# Patient Record
Sex: Female | Born: 1980 | Race: White | Hispanic: No | Marital: Married | Smoking: Former smoker
Health system: Southern US, Community
[De-identification: ages and names within clinical notes are randomized; demographics above are authoritative.]

## PROBLEM LIST (undated history)

## (undated) DIAGNOSIS — Z8759 Personal history of other complications of pregnancy, childbirth and the puerperium: Secondary | ICD-10-CM

## (undated) DIAGNOSIS — IMO0001 Reserved for inherently not codable concepts without codable children: Secondary | ICD-10-CM

## (undated) DIAGNOSIS — R519 Headache, unspecified: Secondary | ICD-10-CM

## (undated) DIAGNOSIS — F329 Major depressive disorder, single episode, unspecified: Secondary | ICD-10-CM

## (undated) DIAGNOSIS — F419 Anxiety disorder, unspecified: Secondary | ICD-10-CM

## (undated) DIAGNOSIS — R51 Headache: Secondary | ICD-10-CM

## (undated) DIAGNOSIS — F32A Depression, unspecified: Secondary | ICD-10-CM

## (undated) DIAGNOSIS — I10 Essential (primary) hypertension: Secondary | ICD-10-CM

## (undated) DIAGNOSIS — F319 Bipolar disorder, unspecified: Secondary | ICD-10-CM

## (undated) DIAGNOSIS — N13721 Vesicoureteral-reflux with reflux nephropathy without hydroureter, unilateral: Secondary | ICD-10-CM

## (undated) HISTORY — DX: Vesicoureteral-reflux with reflux nephropathy without hydroureter, unilateral: N13.721

## (undated) HISTORY — DX: Depression, unspecified: F32.A

## (undated) HISTORY — DX: Personal history of other complications of pregnancy, childbirth and the puerperium: Z87.59

## (undated) HISTORY — DX: Essential (primary) hypertension: I10

## (undated) HISTORY — DX: Major depressive disorder, single episode, unspecified: F32.9

---

## 2004-06-21 ENCOUNTER — Ambulatory Visit: Payer: Self-pay | Admitting: Gynecology

## 2004-11-05 ENCOUNTER — Inpatient Hospital Stay (HOSPITAL_COMMUNITY): Admission: AD | Admit: 2004-11-05 | Discharge: 2004-11-07 | Payer: Self-pay | Admitting: Gynecology

## 2004-11-10 ENCOUNTER — Ambulatory Visit: Payer: Self-pay

## 2009-05-31 ENCOUNTER — Emergency Department: Payer: Self-pay | Admitting: Unknown Physician Specialty

## 2010-01-05 ENCOUNTER — Ambulatory Visit: Payer: Self-pay | Admitting: Internal Medicine

## 2010-02-09 DIAGNOSIS — Z8759 Personal history of other complications of pregnancy, childbirth and the puerperium: Secondary | ICD-10-CM

## 2010-02-09 HISTORY — DX: Personal history of other complications of pregnancy, childbirth and the puerperium: Z87.59

## 2010-02-21 ENCOUNTER — Ambulatory Visit: Payer: Self-pay | Admitting: Obstetrics and Gynecology

## 2010-02-21 LAB — CONVERTED CEMR LAB
Antibody Screen: NEGATIVE
Hemoglobin: 16.1 g/dL — ABNORMAL HIGH (ref 12.0–15.0)
Hepatitis B Surface Ag: NEGATIVE
MCHC: 33.5 g/dL (ref 30.0–36.0)
Monocytes Relative: 4 % (ref 3–12)
Neutro Abs: 8.3 10*3/uL — ABNORMAL HIGH (ref 1.7–7.7)
Platelets: 289 10*3/uL (ref 150–400)
RBC: 4.85 M/uL (ref 3.87–5.11)
Rubella: 46.8 intl units/mL — ABNORMAL HIGH

## 2010-02-22 ENCOUNTER — Ambulatory Visit (HOSPITAL_COMMUNITY): Admission: RE | Admit: 2010-02-22 | Discharge: 2010-02-22 | Payer: Self-pay | Admitting: Obstetrics & Gynecology

## 2010-02-22 ENCOUNTER — Ambulatory Visit: Payer: Self-pay | Admitting: Obstetrics & Gynecology

## 2010-02-27 ENCOUNTER — Inpatient Hospital Stay (HOSPITAL_COMMUNITY): Admission: AD | Admit: 2010-02-27 | Discharge: 2010-02-27 | Payer: Self-pay | Admitting: Obstetrics & Gynecology

## 2010-02-27 ENCOUNTER — Ambulatory Visit: Payer: Self-pay | Admitting: Advanced Practice Midwife

## 2010-02-28 ENCOUNTER — Ambulatory Visit: Payer: Self-pay | Admitting: Obstetrics & Gynecology

## 2010-02-28 ENCOUNTER — Encounter: Payer: Self-pay | Admitting: Family Medicine

## 2010-02-28 LAB — CONVERTED CEMR LAB
Creatinine 24 HR UR: 1657 mg/24hr (ref 700–1800)
Creatinine, Urine: 73.6 mg/dL

## 2010-03-01 ENCOUNTER — Inpatient Hospital Stay (HOSPITAL_COMMUNITY): Admission: AD | Admit: 2010-03-01 | Discharge: 2010-03-01 | Payer: Self-pay | Admitting: Obstetrics & Gynecology

## 2010-03-01 ENCOUNTER — Ambulatory Visit: Payer: Self-pay | Admitting: Nurse Practitioner

## 2010-03-03 ENCOUNTER — Ambulatory Visit: Payer: Self-pay | Admitting: Obstetrics and Gynecology

## 2010-03-07 ENCOUNTER — Ambulatory Visit: Payer: Self-pay | Admitting: Obstetrics and Gynecology

## 2010-03-07 LAB — CONVERTED CEMR LAB
Trich, Wet Prep: NONE SEEN
Yeast Wet Prep HPF POC: NONE SEEN

## 2010-10-14 ENCOUNTER — Ambulatory Visit: Payer: Self-pay

## 2010-10-20 ENCOUNTER — Other Ambulatory Visit: Payer: 59

## 2010-10-20 ENCOUNTER — Encounter (INDEPENDENT_AMBULATORY_CARE_PROVIDER_SITE_OTHER): Payer: Self-pay | Admitting: *Deleted

## 2010-10-20 DIAGNOSIS — R030 Elevated blood-pressure reading, without diagnosis of hypertension: Secondary | ICD-10-CM

## 2010-10-20 DIAGNOSIS — N912 Amenorrhea, unspecified: Secondary | ICD-10-CM

## 2010-10-20 LAB — CONVERTED CEMR LAB: TSH: 0.9 microintl units/mL (ref 0.350–4.500)

## 2010-11-27 LAB — URINALYSIS, ROUTINE W REFLEX MICROSCOPIC
Bilirubin Urine: NEGATIVE
Bilirubin Urine: NEGATIVE
Glucose, UA: NEGATIVE mg/dL
Glucose, UA: NEGATIVE mg/dL
Ketones, ur: NEGATIVE mg/dL
Nitrite: NEGATIVE
Protein, ur: NEGATIVE mg/dL
Protein, ur: NEGATIVE mg/dL
Specific Gravity, Urine: 1.025 (ref 1.005–1.030)
Specific Gravity, Urine: >1.03 — ABNORMAL HIGH (ref 1.005–1.030)
Urobilinogen, UA: 0.2 mg/dL (ref 0.0–1.0)
pH: 5.5 (ref 5.0–8.0)
pH: 5.5 (ref 5.0–8.0)

## 2010-11-27 LAB — URINE MICROSCOPIC-ADD ON

## 2010-11-27 LAB — CBC
Hemoglobin: 14.9 g/dL (ref 12.0–15.0)
MCHC: 35.3 g/dL (ref 30.0–36.0)
Platelets: 249 10*3/uL (ref 150–400)
RDW: 12.4 % (ref 11.5–15.5)

## 2010-11-27 LAB — HCG, QUANTITATIVE, PREGNANCY: hCG, Beta Chain, Quant, S: 436 m[IU]/mL — ABNORMAL HIGH (ref <5)

## 2010-11-27 LAB — RH IMMUNE GLOBULIN WORKUP (NOT WOMEN'S HOSP): Antibody Screen: NEGATIVE

## 2010-12-19 ENCOUNTER — Ambulatory Visit (INDEPENDENT_AMBULATORY_CARE_PROVIDER_SITE_OTHER): Payer: 59 | Admitting: Family Medicine

## 2010-12-19 ENCOUNTER — Encounter: Payer: Self-pay | Admitting: Family Medicine

## 2010-12-19 DIAGNOSIS — R5381 Other malaise: Secondary | ICD-10-CM

## 2010-12-19 DIAGNOSIS — R300 Dysuria: Secondary | ICD-10-CM

## 2010-12-19 DIAGNOSIS — Z8759 Personal history of other complications of pregnancy, childbirth and the puerperium: Secondary | ICD-10-CM

## 2010-12-19 DIAGNOSIS — Z136 Encounter for screening for cardiovascular disorders: Secondary | ICD-10-CM

## 2010-12-19 DIAGNOSIS — N13721 Vesicoureteral-reflux with reflux nephropathy without hydroureter, unilateral: Secondary | ICD-10-CM

## 2010-12-19 DIAGNOSIS — R5383 Other fatigue: Secondary | ICD-10-CM

## 2010-12-19 DIAGNOSIS — F329 Major depressive disorder, single episode, unspecified: Secondary | ICD-10-CM

## 2010-12-19 DIAGNOSIS — I10 Essential (primary) hypertension: Secondary | ICD-10-CM

## 2010-12-19 DIAGNOSIS — F3289 Other specified depressive episodes: Secondary | ICD-10-CM

## 2010-12-19 LAB — BASIC METABOLIC PANEL
CO2: 28 mEq/L (ref 19–32)
Calcium: 9.2 mg/dL (ref 8.4–10.5)
Chloride: 101 mEq/L (ref 96–112)
Creatinine, Ser: 0.6 mg/dL (ref 0.4–1.2)
GFR: 127.31 mL/min (ref >60.00)
Glucose, Bld: 91 mg/dL (ref 70–99)
Potassium: 4.1 mEq/L (ref 3.5–5.1)
Sodium: 138 mEq/L (ref 135–145)

## 2010-12-19 LAB — POCT URINALYSIS DIPSTICK
Bilirubin, UA: NEGATIVE
Glucose, UA: NEGATIVE
Nitrite, UA: POSITIVE
Protein, UA: 30
Urobilinogen, UA: 0.2

## 2010-12-19 LAB — LIPID PANEL
Cholesterol: 195 mg/dL (ref 0–200)
Total CHOL/HDL Ratio: 6
VLDL: 73.6 mg/dL — ABNORMAL HIGH (ref 0.0–40.0)

## 2010-12-19 LAB — CBC WITH DIFFERENTIAL/PLATELET
Basophils Relative: 0.3 % (ref 0.0–3.0)
Lymphocytes Relative: 20.7 % (ref 12.0–46.0)
Lymphs Abs: 2 10*3/uL (ref 0.7–4.0)
MCHC: 35.3 g/dL (ref 30.0–36.0)
Monocytes Relative: 3.1 % (ref 3.0–12.0)
Neutro Abs: 7.2 10*3/uL (ref 1.4–7.7)

## 2010-12-19 NOTE — Patient Instructions (Signed)
Great to meet you. Please stop by to see Marion on your way out. 

## 2010-12-19 NOTE — Assessment & Plan Note (Signed)
Continue Labetolol 100 mg twice daily. Check BMET and renal ultrasound given her history.

## 2010-12-19 NOTE — Assessment & Plan Note (Signed)
UA pos for LE.   Will send for culture first since she is asymptomatic and not yet pregnant. The patient indicates understanding of these issues and agrees with the plan. Will also schedule renal ultrasound due to HTN.

## 2010-12-19 NOTE — Assessment & Plan Note (Signed)
Likely multifactorial. Will check labs- TSH, VIt D, CBC, B12 to rule out reversible causes. Depression likely playing a role, refer to psych.

## 2010-12-19 NOTE — Progress Notes (Signed)
30 yo here to establish care.  1. HTN- diagnosed 7 years ago.  On Labetolol 100 mg twice daily because she has been trying to get pregnant. 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  Never had an ultrasound of kidneys as adults. BP has been very high- 170s/100s. She is currently trying to get pregnant again. Denies any CP, SOB, blurred vision or LE edema.  2.  Fatigue- very fatigued for past several months.  Does not feel it is worse on BP medication. Admits to symptoms of depression- tearfulness,anhedonia.  Denies any SI or HI. Does not want any antidepressants because she is trying to get pregnant. Work has been more stressful, at times having difficulty falling asleep at night because she is thinking about work.  3. ?UTI- has frequent UTIs.  Currently asymptomatic but would like to get UA.  The PMH, PSH, Social History, Family History, Medications, and allergies have been reviewed in [facility], and have been updated if relevant.  ROS: General: Denies fever, chills, sweats. Endorses weight gain. Eyes: Denies blurring,significant itching ENT: Denies earache, sore throat, and hoarseness.  Cardiovascular: Denies chest pains, palpitations, dyspnea on exertion,  Respiratory: Denies cough, dyspnea at rest,wheeezing Breast: no concerns about lumps GI: Denies nausea, vomiting, diarrhea, constipation, change in bowel habits, abdominal pain, melena, hematochezia GU: Denies dysuria, hematuria, urinary hesitancy, nocturia, denies STD risk, no concerns about discharge Musculoskeletal: Denies back pain, joint pain Derm: Denies rash, itching Neuro: Denies  paresthesias, frequent falls, frequent headaches Psych: Denies anxiety, SI or HI.  Endorses depression. Endocrine: Denies cold intolerance, heat intolerance, polydipsia Heme: Denies enlarged lymph nodes Allergy: No hayfever  Physical exam: BP 136/88  Pulse 82  Temp 98.6 F (37 C)  Wt 179 lb 12.8 oz (81.557 kg)  General:   Well-developed,well-nourished,in no acute distress; alert,appropriate and cooperative throughout examination Head:  normocephalic and atraumatic.   Eyes:  vision grossly intact, pupils equal, pupils round, and pupils reactive to light.   Ears:  R ear normal and L ear normal.   Nose:  no external deformity.   Mouth:  good dentition.   Neck:  No deformities, masses, or tenderness noted. Lungs:  Normal respiratory effort, chest expands symmetrically. Lungs are clear to auscultation, no crackles or wheezes. Heart:  Normal rate and regular rhythm. S1 and S2 normal without gallop, murmur, click, rub or other extra sounds. Abdomen:  Bowel sounds positive,abdomen soft and non-tender without masses, organomegaly or hernias noted. Msk:  No deformity or scoliosis noted of thoracic or lumbar spine.   Extremities:  No clubbing, cyanosis, edema, or deformity noted with normal full range of motion of all joints.   Neurologic:  alert & oriented X3 and gait normal.   Skin:  Intact without suspicious lesions or rashes Cervical Nodes:  No lymphadenopathy noted Axillary Nodes:  No palpable lymphadenopathy Psych:  Cognition and judgment appear intact. Alert and cooperative with normal attention span and concentration. No apparent delusions, illusions, hallucinations

## 2010-12-19 NOTE — Assessment & Plan Note (Signed)
Deteriorated. Refer for psychotherapy. Follow up in one month.

## 2010-12-20 ENCOUNTER — Ambulatory Visit: Payer: Self-pay | Admitting: Family Medicine

## 2010-12-20 ENCOUNTER — Other Ambulatory Visit: Payer: Self-pay | Admitting: Family Medicine

## 2010-12-20 LAB — VITAMIN D 25 HYDROXY (VIT D DEFICIENCY, FRACTURES): Vit D, 25-Hydroxy: 31 ng/mL (ref 30–89)

## 2010-12-20 MED ORDER — CIPROFLOXACIN HCL 500 MG PO TABS: 500.0000 mg | ORAL_TABLET | Freq: Two times a day (BID) | ORAL | Status: AC

## 2010-12-21 ENCOUNTER — Encounter: Payer: Self-pay | Admitting: *Deleted

## 2010-12-21 LAB — URINE CULTURE: Colony Count: >=100000

## 2010-12-22 ENCOUNTER — Encounter: Payer: Self-pay | Admitting: Family Medicine

## 2010-12-28 ENCOUNTER — Ambulatory Visit (INDEPENDENT_AMBULATORY_CARE_PROVIDER_SITE_OTHER): Payer: 59 | Admitting: Psychology

## 2010-12-28 DIAGNOSIS — F331 Major depressive disorder, recurrent, moderate: Secondary | ICD-10-CM

## 2011-01-17 ENCOUNTER — Ambulatory Visit: Payer: 59 | Admitting: Psychology

## 2011-07-27 ENCOUNTER — Emergency Department: Payer: Self-pay | Admitting: *Deleted

## 2011-12-01 ENCOUNTER — Emergency Department: Payer: Self-pay | Admitting: Emergency Medicine

## 2011-12-01 LAB — CBC
MCH: 33.3 pg (ref 26.0–34.0)
MCHC: 34.7 g/dL (ref 32.0–36.0)
MCV: 96 fL (ref 80–100)
Platelet: 287 10*3/uL (ref 150–440)
RBC: 4.26 10*6/uL (ref 3.80–5.20)
WBC: 9.5 10*3/uL (ref 3.6–11.0)

## 2011-12-01 LAB — COMPREHENSIVE METABOLIC PANEL
Alkaline Phosphatase: 49 U/L — ABNORMAL LOW (ref 50–136)
Bilirubin,Total: 0.7 mg/dL (ref 0.2–1.0)
Calcium, Total: 9.3 mg/dL (ref 8.5–10.1)
Creatinine: 0.74 mg/dL (ref 0.60–1.30)
EGFR (African American): >60
EGFR (Non-African Amer.): >60
Glucose: 82 mg/dL (ref 65–99)
Potassium: 3.1 mmol/L — ABNORMAL LOW (ref 3.5–5.1)
Sodium: 136 mmol/L (ref 136–145)

## 2011-12-02 ENCOUNTER — Emergency Department: Payer: Self-pay | Admitting: Emergency Medicine

## 2011-12-02 LAB — DRUG SCREEN, URINE
Barbiturates, Ur Screen: NEGATIVE (ref ?–200)
Benzodiazepine, Ur Scrn: POSITIVE (ref ?–200)
Cannabinoid 50 Ng, Ur ~~LOC~~: NEGATIVE (ref ?–50)
Cocaine Metabolite,Ur ~~LOC~~: NEGATIVE (ref ?–300)
Methadone, Ur Screen: NEGATIVE (ref ?–300)
Phencyclidine (PCP) Ur S: NEGATIVE (ref ?–25)
Tricyclic, Ur Screen: NEGATIVE (ref ?–1000)

## 2011-12-02 LAB — SALICYLATE LEVEL: Salicylates, Serum: <1.7 mg/dL

## 2011-12-02 LAB — URINALYSIS, COMPLETE
Bilirubin,UR: NEGATIVE
Glucose,UR: NEGATIVE mg/dL (ref 0–75)
Ketone: NEGATIVE
Nitrite: NEGATIVE
RBC,UR: NONE SEEN /HPF (ref 0–5)
Squamous Epithelial: 46
WBC UR: 5 /HPF (ref 0–5)

## 2011-12-02 LAB — COMPREHENSIVE METABOLIC PANEL
Albumin: 4 g/dL (ref 3.4–5.0)
Alkaline Phosphatase: 51 U/L (ref 50–136)
Anion Gap: 10 (ref 7–16)
Bilirubin,Total: 0.4 mg/dL (ref 0.2–1.0)
Calcium, Total: 8.7 mg/dL (ref 8.5–10.1)
Co2: 31 mmol/L (ref 21–32)
Creatinine: 0.77 mg/dL (ref 0.60–1.30)
Glucose: 99 mg/dL (ref 65–99)
Osmolality: 282 (ref 275–301)
Potassium: 3.2 mmol/L — ABNORMAL LOW (ref 3.5–5.1)
Sodium: 143 mmol/L (ref 136–145)

## 2011-12-02 LAB — CBC
HCT: 40.6 % (ref 35.0–47.0)
MCH: 33.7 pg (ref 26.0–34.0)
MCHC: 35.6 g/dL (ref 32.0–36.0)
MCV: 95 fL (ref 80–100)
RDW: 12.9 % (ref 11.5–14.5)

## 2011-12-02 LAB — TSH: Thyroid Stimulating Horm: 1.42 u[IU]/mL

## 2011-12-02 LAB — ETHANOL: Ethanol: <3 mg/dL

## 2012-01-01 ENCOUNTER — Ambulatory Visit: Payer: Self-pay | Admitting: Family Medicine

## 2012-01-10 ENCOUNTER — Ambulatory Visit: Payer: Self-pay | Admitting: Neurology

## 2012-01-10 LAB — CREATININE, SERUM
Creatinine: 0.77 mg/dL (ref 0.60–1.30)
EGFR (African American): >60
EGFR (Non-African Amer.): >60

## 2012-07-06 ENCOUNTER — Ambulatory Visit: Payer: Self-pay | Admitting: Physician Assistant

## 2012-10-03 ENCOUNTER — Ambulatory Visit: Payer: Self-pay | Admitting: Internal Medicine

## 2012-10-16 ENCOUNTER — Ambulatory Visit: Payer: Self-pay | Admitting: Family Medicine

## 2013-12-29 ENCOUNTER — Ambulatory Visit: Payer: Self-pay | Admitting: Physician Assistant

## 2014-03-21 IMAGING — US ULTRASOUND LEFT BREAST
1 series · 13 of 13 positions shown · non-contrast
Comparison: None. This is the patient's baseline mammogram.

REASON FOR EXAM: LT BR PAIN 3 OCLOCK
COMMENTS:

PROCEDURE:     US  - US BREAST LEFT  - October 16, 2012  [DATE]
RESULT:

[Series 1: ultrasound left breast · 0.14mm/px · 13 of 13 slices shown]
[im 1/13]
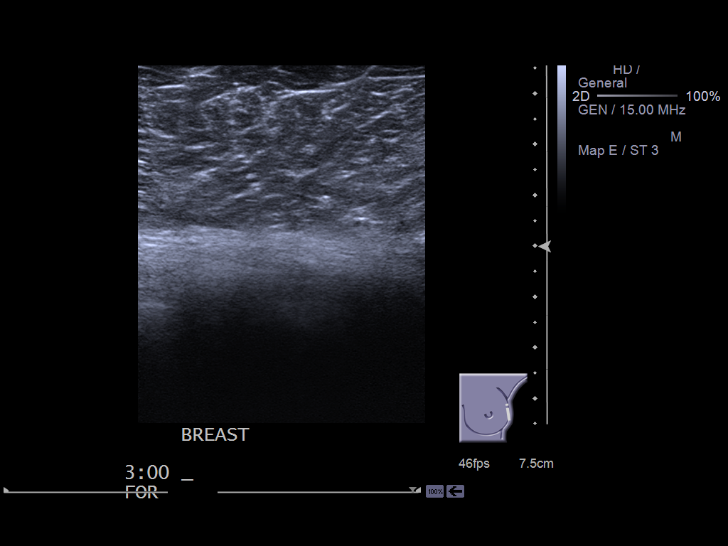
[im 2/13]
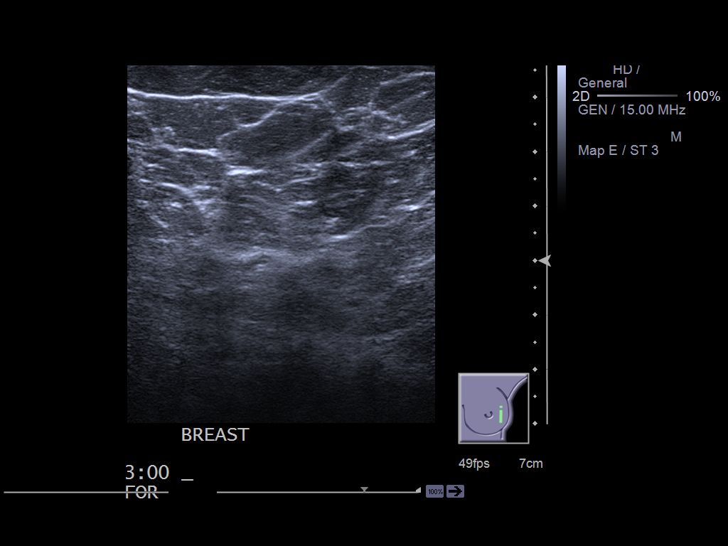
[im 3/13]
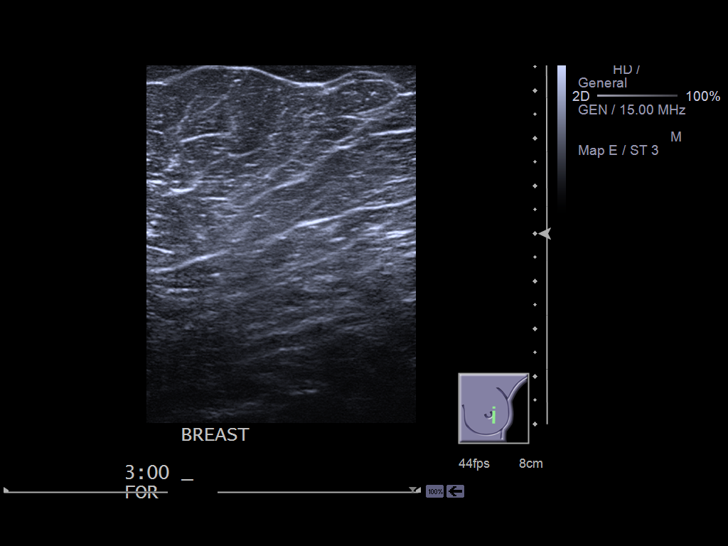
[im 4/13]
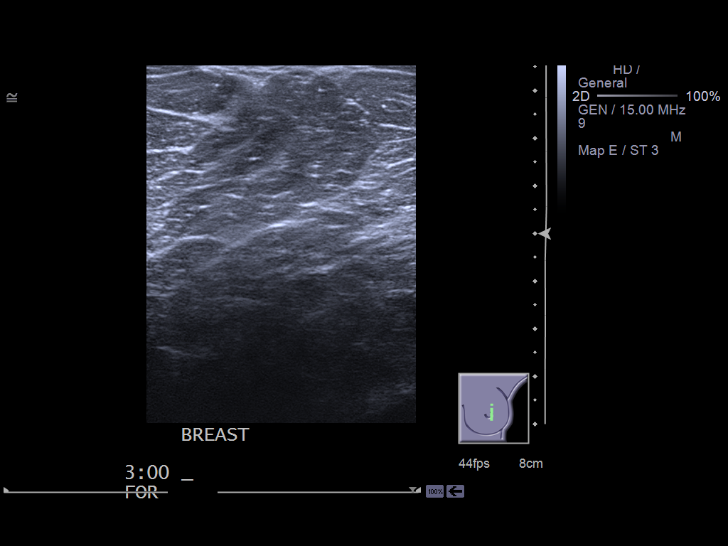
[im 5/13]
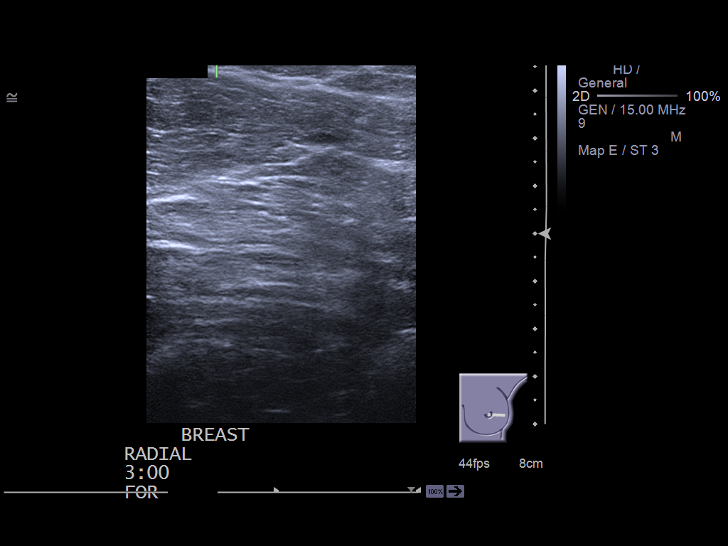
[im 6/13]
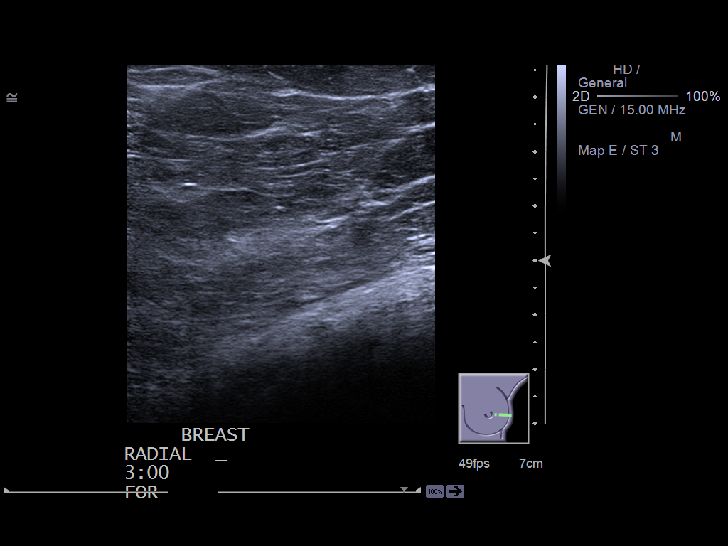
[im 7/13]
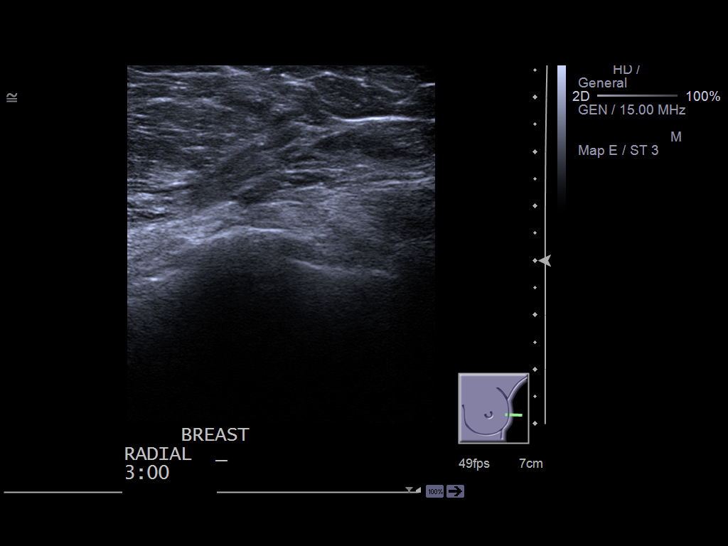
[im 8/13]
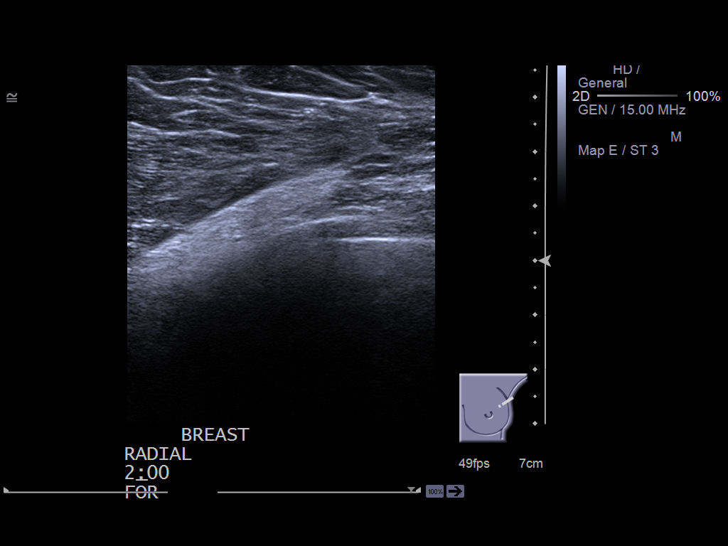
[im 9/13]
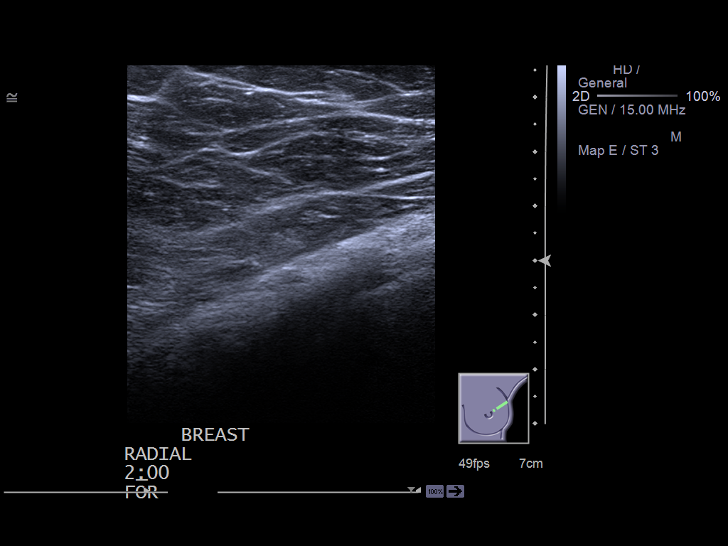
[im 10/13]
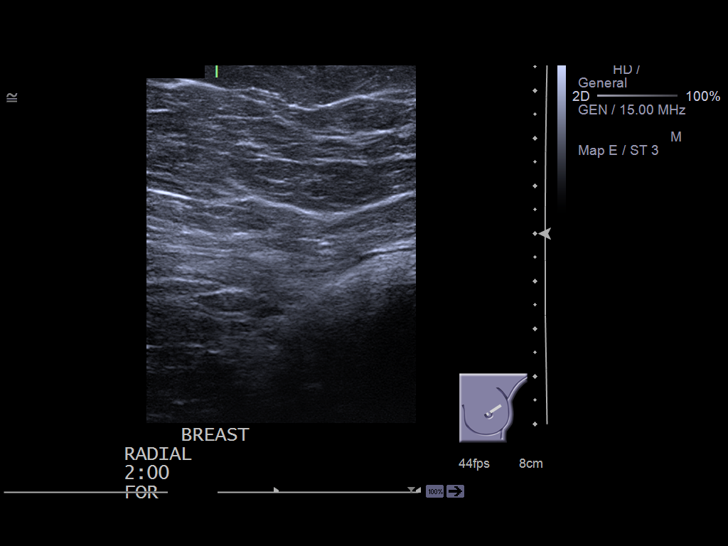
[im 11/13]
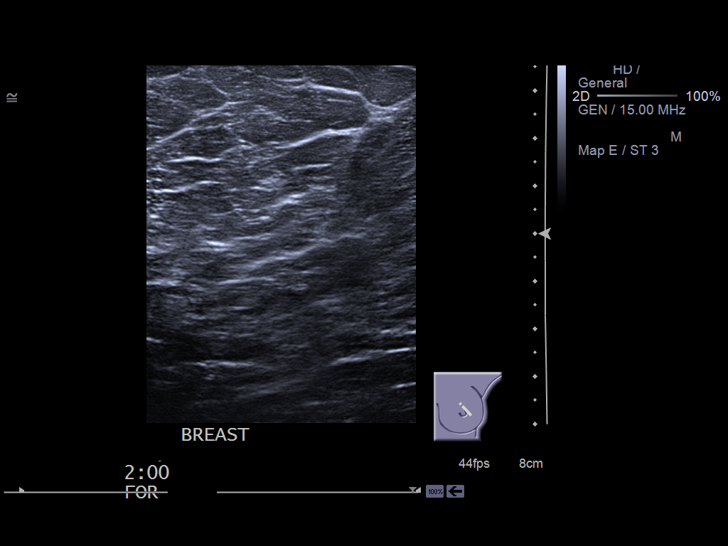
[im 12/13]
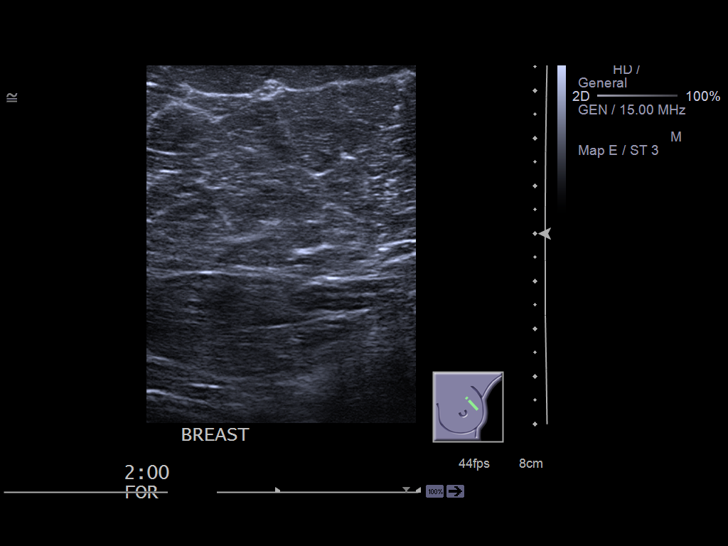
[im 13/13]
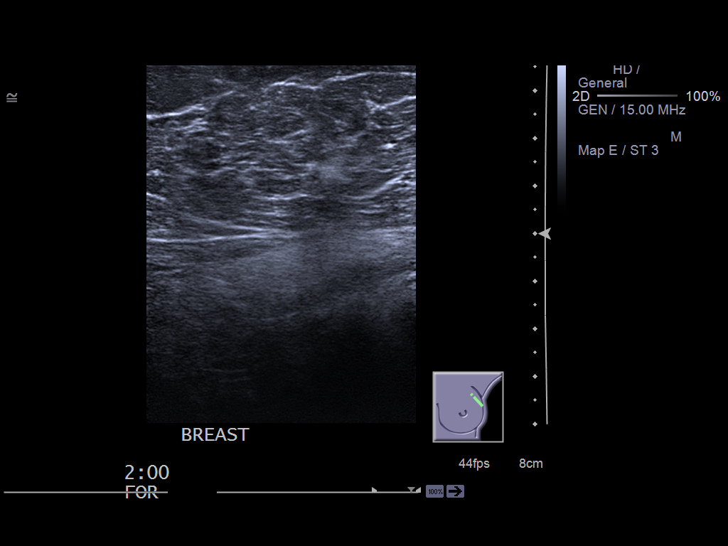

[13 of 13 positions shown; findings below may reference images not displayed]

FINDINGS: The breast tissue is almost entirely fatty. No suspicious masses or
calcifications are identified. No areas of architectural distortion. Spot
compression magnification views are performed of the far lateral aspect of
the left breast at approximately [DATE] secondary to reported breast pain at
this site. No mass or focal asymmetry was identified.

Real-time ultrasound was performed of the left breast from [DATE] to [DATE] to
include the region of the patient's reported pain. No mass or suspicious
shadowing is identified.
IMPRESSION: 1.     BI-RADS: Category 1 Negative.
2.     There is no mammographic or sonographic evidence of malignancy.
Recommend further evaluation and management of the patient's breast pain be
based on clinical grounds.
3.     Annual screening mammography is recommended starting at age 40.
Screening mammography can be performed prior to that date at the discretion
of the patient and the patient's physician.

BREAST COMPOSITION:  The breast composition is ALMOST ENTIRELY FATTY
(glandular tissue is less than 25%).

[REDACTED]

## 2014-07-13 ENCOUNTER — Encounter: Payer: Self-pay | Admitting: Family Medicine

## 2015-01-03 NOTE — Consult Note (Signed)
PATIENT NAME:  Cassandra Carter, Cassandra Carter MR#:  [id number] DATE OF BIRTH:  February 26, 1981  DATE OF CONSULTATION:  12/04/2011  REFERRING PHYSICIAN:  Emergency Room doctor, Dr. Marilynne HalstedBoland  CONSULTING PHYSICIAN:  Venida JarvisWilliam James Abed Schar II, MD  REASON FOR CONSULTATION: Depression.   HISTORY OF PRESENT ILLNESS: The patient presented to the Emergency Room with depression in a weekend that she felt was bad. She reports recurrent depression all of her life slightly worse for the last two months. Symptoms have included depressed mood, slight decrease in interest, slight increase in guilt, slight decrease in concentration, some helplessness and hopelessness. Had passive suicidal thoughts this weekend. Has had some in the past two months but no actual plans.   The patient had been treated with Effexor in the past which did help but it caused increased blood pressure. She also reports Lexapro has helped in the past. Has never been on Celexa. Since coming to the Emergency Room, she has been placed on Wellbutrin, Celexa, and Risperdal. No history of psychosis. No history of mania. She may have had one panic attack over the weekend which aggravated her circumstances. In addition, she developed some ear pain which now is thought to be otitis media and she is being treated for that.   FAMILY HISTORY: Her sister had anger issues, otherwise negative.   PAST HISTORY: Hypertension, is on hydrochlorothiazide for that. Has been placed on another antihypertensive since coming to the hospital.   REVIEW OF SYSTEMS: She had left ear pain, now better; also had some slight sore throat which is better, otherwise negative.   SOCIAL HISTORY: The patient works as a Engineer, materialsteller at a bank. She has an Associate's Degree from college. She has been married two years. She has a 34-year-old son and also a supportive family and parents. She quit smoking two months ago which again could have been a precipitant to this depression. She has been a social drinker. No drugs.  When well, she enjoys drawing.   MENTAL STATUS EXAMINATION: Mental status revealed a white female who looked her stated age, cooperative, coherent, and able to give me a fairly good history. Not as distressed as on admission. She did not appear to be severely depressed but perhaps just slightly depressed but no psychomotor retardation. No hallucinations or delusions. Insight and judgment was fair to good. She was oriented x4, knew the presidents backwards x2, remembered 3 of 3 objects at one and three minutes.   DIAGNOSES:  AXIS I: Major depressive episode, mild.   AXIS III:  1. History of hypertension. 2. Probably otitis media.   AXIS IV: Some medical problems, lack of access to care.   AXIS V: 45. The patient is depressed, currently is not suicidal.   ASSESSMENT AND PLAN: The patient should be discharged. She will follow-up with a psychiatrist. Send the patient home on Celexa 20 mg daily. She also needs treatment for otitis media.   SUICIDE RISK ASSESSMENT: Current attempt negative; compulsivity negative; ideation intention plan negative; panic positive, high anxiety terminal and negative; anhedonia negative; concentration negative; hopelessness negative; insomnia negative; energy positive; treatment alliance negative; illness pain positive; prior attempts negative; substance abuse negative; family history negative; childhood abuse negative; marital status negative; family/children negative; firearms negative; age greater than 6865 negative; gender female negative.   ____________________________ Venida JarvisWilliam James Hosanna Betley II, MD [id number] D: 12/04/2011 11:49:37 ET T: 12/04/2011 12:07:57 ET JOB#: [id number]  cc: Venida JarvisWilliam James Keinan Brouillet II, MD, <Dictator> Jules HusbandsWILLIAM J Deklin Bieler MD ELECTRONICALLY SIGNED 12/06/2011 16:44

## 2015-05-18 ENCOUNTER — Encounter: Payer: BLUE CROSS/BLUE SHIELD | Attending: Psychiatry | Admitting: Dietician

## 2015-05-18 ENCOUNTER — Encounter: Payer: Self-pay | Admitting: Dietician

## 2015-05-18 VITALS — Ht 63.0 in | Wt 217.9 lb

## 2015-05-18 DIAGNOSIS — E669 Obesity, unspecified: Secondary | ICD-10-CM

## 2015-05-18 DIAGNOSIS — F509 Eating disorder, unspecified: Secondary | ICD-10-CM

## 2015-05-18 NOTE — Patient Instructions (Addendum)
Continue to work on establishing consistent meal and snack times. 8:30 breakfast 11:30 lunch 3:00 snack 6:30 dinner 8:30 snack  Continue to decrease sugar-sweetened beverages.  Balance meals with protein, 1-2 starch servings and a fruit or non-starchy vegetable serving or both. Refer to examples.  Include a snack between lunch and dinner that includes a starch and a protein food.  Limit snacking after dinner to 1 snack. Refer to examples. Avoid reading food labels.  Continue to keep food records.

## 2015-05-18 NOTE — Progress Notes (Signed)
Medical Nutrition Therapy: Visit start time: 13:30 end time: 14:30 Assessment:  Diagnosis: obesity, eating disorder Past medical history: hypertension, kidney disease Psychosocial issues/ stress concerns: Patient rates her stress as "high" and indicates "not so well" as to how she is dealing with her stress. She had her 1st session last week with Kara Dies [facility]), a clinical social worker who specializes in eating disorders.  Preferred learning method:  . Auditory . Visual Current weight: 217.9 lbs  Height: 63 in Medications, supplements: see list Progress and evaluation:  Patient came for initial medical nutrition evaluation visit. She states she has alternated between binge eating and not eating as long as she can remember and began purging approximately 3 years ago. Presently she purges by vomiting approximately 2 x/week. She reports that her not being able to decide whether to eat or what to eat has caused issues in her relationship with her husband and this prompted her to reach out for help. She reports her weight has been relatively stable in past 3 years. After meeting with her counselor, she has been keeping some food records, indicating also her emotional state at time of eating. She has been able at least once to distract herself and avoid vomiting when she felt she had eaten too much.  She and her husband live next to her mother and she reports her mother has been preparing some evening meals in order to help out during this time. Her diet is low in fruits/vegetables and calcium sources. She has decreased soda (Coke) from 12 (12oz) cans per day to 2-liters over a 3-4 day period. She also is drinking lemonade to help her limit soda. Physical activity: none  Dietary Intake:  Usual eating pattern includes 3 meals and 1-2 snacks per day. Dining out frequency: 2 meals per week.  Breakfast: flavored oatmeal or cereal/milk Lunch: cheeseburger/fries, lemonade or  Coke Supper: meats and vegetables prepared by her mother or she sometimes doesn't eat and snacks later Snack: cheese, peanut butter, salty snacks Beverages: Coke, lemonade, "I can't drink water. It nauseates me".  Nutrition Care Education: Basic nutrition: Commended on efforts to establish a more consistent meal pattern and affirmed that is an important first step when working to decrease binge/purge behavior. She has expressed frustration and sense of being overwhelmed when reading food labels and I encouraged her to avoid label reading presently. Used food guide plate to show balance of protein, carbohydrate and non-starchy vegetables.Gave and reviewed sample menus as well as a hand-out with quick and balanced meals that can easily be put together.  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  As evidenced by binge/purging behavior per MD notes and diet history.  Intervention: Continue to work on establishing consistent meal and snack times. 8:30 breakfast 11:30 lunch 3:00 snack 6:30 dinner 8:30 snack  Continue to decrease sugar-sweetened beverages. Balance meals with protein, 1-2 starch servings and a fruit or non-starchy vegetable serving or both. Refer to examples. Include a snack between lunch and dinner that includes a starch and a protein food. Limit snacking after dinner to 1 snack. Refer to examples. Avoid reading food labels. Continue to keep food records.  Education Materials given:  . Food lists/ Planning A Balanced Meal . Quick and Healthy Meals . Sample meal pattern/ menus . Goals/ instructions Learner/ who was taught:  . Patient  Level of understanding: . Partial understanding; needs review/ practice Learning barriers: . None Willingness to learn/ readiness for change: .  Acceptance, ready for change Monitoring and Evaluation:  Dietary intake, exercise,  and body weight      follow up: 06/01/15 at 10:30am

## 2015-06-01 ENCOUNTER — Encounter: Payer: Self-pay | Admitting: Dietician

## 2015-06-01 ENCOUNTER — Encounter (INDEPENDENT_AMBULATORY_CARE_PROVIDER_SITE_OTHER): Payer: BLUE CROSS/BLUE SHIELD | Admitting: Dietician

## 2015-06-01 VITALS — Ht 63.0 in | Wt 219.2 lb

## 2015-06-01 DIAGNOSIS — E669 Obesity, unspecified: Secondary | ICD-10-CM

## 2015-06-01 DIAGNOSIS — F509 Eating disorder, unspecified: Secondary | ICD-10-CM

## 2015-06-01 NOTE — Patient Instructions (Signed)
Continue with pattern of 3 meals and 1-2 snacks. Continue to use food plate to help balance meals. Increase vegetables, especially non-starchy vegetables. Ride bike for 10 minutes, 1 day per week working up to 3 days/week.

## 2015-06-01 NOTE — Progress Notes (Signed)
Medical Nutrition Therapy Follow-up visit:  Time:10:30-11:30 am Visit #:2 ASSESSMENT:  Diagnosis:obesity, eating disorder  Current weight:219.2    Height:63 in Medications: See list Medical History: Progress and evaluation: Patient in for follow-up appointment. She has followed through to establish a more consistent pattern of eating. She kept food records recording her food intake on a divided food guide plate (drawing). She also recorded her emotions/feelings at meal time. She continues to purge by vomiting 1-2 times per week and indicates would probably do it more frequently if she were not working.  She has also followed through with goal of including a protein and carbohydrate for an afternoon snack; she measure 1/4 cup nuts with 1/4 cup banana chips and puts in snack baggies. She is frequently making high fat choices which leads to a sense of fullness increasing desire to purge. She reports that she was pleased with making progress with more consistent meal times and spacing but then had a "bad" weekend because she was "tired of doing it". She also indicates frequent tension between she and her husband which she discusses with her therapist. Physical activity:none  NUTRITION CARE EDUCATION: Basic nutrition: Commended patient on following through with goal of establishing a more consistent meal pattern. Discussed how mentally and emotionally tiring that effort can be. Reviewed her food record with her pointing out the positive choices she has made. Discussed meals that were high in fat and might increase chance of purging if she feels too full and discussed ways to have lower fat foods with foods that might be higher in fat to help decrease sense of fullness. Exercise: Discussed how exercise can help increase energy and desire to work on diet/nutrition goals. Discussed realistic options for patient.  INTERVENTION:  Continue with pattern of 3 meals and 1-2 snacks. Continue to use food plate  to help balance meals. Increase vegetables, especially non-starchy vegetables. Ride bike for 10 minutes, 1 day per week working up to 3 days/week.  EDUCATION MATERIALS GIVEN:  . Goals/ instructions  LEARNER/ who was taught:  . Patient  LEVEL OF UNDERSTANDING: . Partial understanding; needs review/ practice LEARNING BARRIERS: . None WILLINGNESS TO LEARN/READINESS FOR CHANGE: . Eager, change in progress  MONITORING AND EVALUATION:  06/08/15 at 10:30am

## 2015-06-08 ENCOUNTER — Encounter (INDEPENDENT_AMBULATORY_CARE_PROVIDER_SITE_OTHER): Payer: BLUE CROSS/BLUE SHIELD | Admitting: Dietician

## 2015-06-08 ENCOUNTER — Encounter: Payer: Self-pay | Admitting: Dietician

## 2015-06-08 VITALS — Wt 218.3 lb

## 2015-06-08 DIAGNOSIS — E669 Obesity, unspecified: Secondary | ICD-10-CM

## 2015-06-08 DIAGNOSIS — F509 Eating disorder, unspecified: Secondary | ICD-10-CM | POA: Diagnosis not present

## 2015-06-08 NOTE — Progress Notes (Signed)
Medical Nutrition Therapy Follow-up visit:  Time:10:35- 11:30 Visit #:3 ASSESSMENT:  Diagnosis:obesity, eating disorder  Current weight:218.3 lbs    Height:63 in Medications: See list Medical History: hypertension, kidney disease Progress and evaluation:Patient reports that she has not purged any in past week. She continues to keep food records and is now recording approximate portion sizes. She is typically eating 3 meals per day. She is not including snack in the afternoon because she states she is not hungry. She continues to include mainly a protein food and starch for meals. She reports she is often hungry at bedtime and eats "any thing that is there". Her diet continues to be low in fruits, vegetables and calcium sources but she continues to be successful in following a consistent meal pattern which has been a priority goal. She canned northern beans this past week and states that canning food is something she has done in the past and enjoys doing. She lost 1 lb in past week.  Physical activity:  She rode her bicycle once in past week for 15 minutes. Rainy weather has made it difficult to ride more frequently.   NUTRITION CARE EDUCATION: Basic nutrition:  Commended patient on her more consistent pattern of eating. Reviewed her food record with her discussing "positives" such as meals that included at least 3 food groups. Discussed portion control especially of starchy foods. Also, discussed simple meals she can prepare for evening meals and the benefit of planning a week's menu for dinner meals incorporating leftovers to avoid cooking a full meal each night. Exercise: Commended on riding her bike and acknowledged that weather has been a limiting factor. Discussed exercise benefits for mental health in addition to physical health. LifeStyle habits: Commended on doing an activity such as "canning" beans that made her feel productive and will provide an easy to prepare healthy food for future  meals.  INTERVENTION: Continue with 3 meals and spaced 4-6 hours apart. Continue to work on increasing vegetables especially non-starchy vegetables. Continue to balance meals with protein, starch (rice, beans potatoes, corn, etc.) and non-starchy vegetables. 1 cup of any starchy food is a good portion at meals. Plan out dinner meals. Ride on bike working up to 3 times per week. If hungry at bedtime, include 1 serving of carbohydrate and 1 oz. of protein  EDUCATION MATERIALS GIVEN:  . Goals/ instructions  LEARNER/ who was taught:  . Patient   LEVEL OF UNDERSTANDING: . Partial understanding; needs review/ practice  WILLINGNESS TO LEARN/READINESS FOR CHANGE: . Eager, change in progress  MONITORING AND EVALUATION:  Weight, diet, exercise Follow-up- 06/17/15 at 1:30pm

## 2015-06-08 NOTE — Patient Instructions (Signed)
Continue with 3 meals and spaced 4-6 hours apart. Continue to work on increasing vegetables especially non-starchy vegetables. Continue to balance meals with protein, starch (rice, beans potatoes, corn, etc.) and non-starchy vegetables. 1 cup of any starchy food is a good portion at meals. Plan out dinner meals. Ride on bike working up to 3 times per week. If hungry at bedtime, include 1 serving of carbohydrate and 1 oz. of protein

## 2015-06-17 ENCOUNTER — Encounter: Payer: Self-pay | Admitting: Dietician

## 2015-06-17 ENCOUNTER — Encounter: Payer: BLUE CROSS/BLUE SHIELD | Attending: Psychiatry | Admitting: Dietician

## 2015-06-17 VITALS — Ht 63.0 in | Wt 218.4 lb

## 2015-06-17 DIAGNOSIS — E669 Obesity, unspecified: Secondary | ICD-10-CM | POA: Insufficient documentation

## 2015-06-17 DIAGNOSIS — F509 Eating disorder, unspecified: Secondary | ICD-10-CM | POA: Diagnosis not present

## 2015-06-17 NOTE — Patient Instructions (Addendum)
Continue with previous goals: Continue with 3 meals and spaced 4-6 hours apart. Continue to work on increasing vegetables especially non-starchy vegetables. Continue to balance meals with protein, starch (rice, beans potatoes, corn, etc.) and non-starchy vegetables. 1 cup of any starchy food is a good portion at meals. Plan out dinner meals. Ride on bike working up to 3 times per week.(This goal has been more difficult due to child activities and getting dark earlier). Reset goal of more exercise by  finding online exercise video). If hungry at bedtime, include 1 serving of carbohydrate and 1 oz. of protein

## 2015-06-17 NOTE — Progress Notes (Signed)
Medical Nutrition Therapy Follow-up visit:  Time:13:30-14:00 Visit #:3 ASSESSMENT:  Diagnosis:obesity, eating disorder  Current weight:218.4 lbs    Height:63 in Medications: See list Medical History: hypertension, kidney disease Progress and evaluation:Patient reports she has not purged in past week which totals 2 weeks without purging. She kept food records which indicate that on most days she is eating 3 meals. Even thought she states that eating breakfast is a challenge, she continues to try to eat  Cereal/milk or fruit, etc. She reports that it has been helpful to know that 1 cup of starch at a meal is not excessive and this helps her to avoid feeling guilty after her meal. She waits after eating her dinner meal and only eats a snack if she is still hungry. She has included more non-starchy vegetables such as green beans, squash, zucchini and carrots as well as salads added to several meals. She states she has find it helpful as suggested to balance meals by including at least 3 food groups, using food guide plate. She also started planning out a menu for at least 4 nights per week.  Physical activity:Went camping with her son (boysc[employer]) and did some hiking, setting up camp, etc.   NUTRITION CARE EDUCATION: Basic nutrition:  Reviewed food records with patient commending on her continued effort to eat 3 meals per day. Reviewed progress with each of the other goals set last week, commending on progress with each goal.  Exercise: Discussed alternative ways to do physical activity when bike riding is not possible or safe.  INTERVENTION: Continue with previous goals: Continue with 3 meals and spaced 4-6 hours apart. Continue to work on increasing vegetables especially non-starchy vegetables. Continue to balance meals with protein, starch (rice, beans potatoes, corn, etc.) and non-starchy vegetables. 1 cup of any starchy food is a good portion at meals. Plan out dinner meals. Ride on bike  working up to 3 times per week.(This goal has been more difficult due to child activities and getting dark earlier). Reset goal of more exercise by  finding online exercise video). If hungry at bedtime, include 1 serving of carbohydrate and 1 oz. of protein   EDUCATION MATERIALS GIVEN:  . Goals/ instructions  LEARNER/ who was taught:  . Patient  LEVEL OF UNDERSTANDING: . Partial understanding; needs review/ practice LEARNING BARRIERS: . None  WILLINGNESS TO LEARN/READINESS FOR CHANGE: . Eager, change in progress  MONITORING AND EVALUATION:  Diet, weight, exercise Follow-up: 06/29/15 at 9:00am.

## 2015-06-25 ENCOUNTER — Encounter: Payer: Self-pay | Admitting: *Deleted

## 2015-06-28 ENCOUNTER — Encounter
Admission: RE | Disposition: A | Discharge status: Home / Self Care | Payer: Self-pay | Source: Ambulatory Visit | Attending: Gastroenterology

## 2015-06-28 ENCOUNTER — Ambulatory Visit: Payer: BLUE CROSS/BLUE SHIELD | Admitting: Anesthesiology

## 2015-06-28 ENCOUNTER — Ambulatory Visit
Admission: RE | Admit: 2015-06-28 | Discharge: 2015-06-28 | Disposition: A | Discharge status: Home / Self Care | Payer: BLUE CROSS/BLUE SHIELD | Source: Ambulatory Visit | Attending: Gastroenterology | Admitting: Gastroenterology

## 2015-06-28 DIAGNOSIS — K219 Gastro-esophageal reflux disease without esophagitis: Secondary | ICD-10-CM | POA: Insufficient documentation

## 2015-06-28 DIAGNOSIS — F419 Anxiety disorder, unspecified: Secondary | ICD-10-CM | POA: Insufficient documentation

## 2015-06-28 DIAGNOSIS — F319 Bipolar disorder, unspecified: Secondary | ICD-10-CM | POA: Diagnosis not present

## 2015-06-28 DIAGNOSIS — Z87440 Personal history of urinary (tract) infections: Secondary | ICD-10-CM | POA: Diagnosis not present

## 2015-06-28 DIAGNOSIS — R1310 Dysphagia, unspecified: Secondary | ICD-10-CM | POA: Diagnosis present

## 2015-06-28 DIAGNOSIS — Z79899 Other long term (current) drug therapy: Secondary | ICD-10-CM | POA: Insufficient documentation

## 2015-06-28 DIAGNOSIS — I10 Essential (primary) hypertension: Secondary | ICD-10-CM | POA: Insufficient documentation

## 2015-06-28 DIAGNOSIS — Z87891 Personal history of nicotine dependence: Secondary | ICD-10-CM | POA: Insufficient documentation

## 2015-06-28 DIAGNOSIS — K2950 Unspecified chronic gastritis without bleeding: Secondary | ICD-10-CM | POA: Diagnosis not present

## 2015-06-28 HISTORY — DX: Headache: R51

## 2015-06-28 HISTORY — DX: Anxiety disorder, unspecified: F41.9

## 2015-06-28 HISTORY — DX: Reserved for inherently not codable concepts without codable children: IMO0001

## 2015-06-28 HISTORY — PX: ESOPHAGOGASTRODUODENOSCOPY (EGD) WITH PROPOFOL: SHX5813

## 2015-06-28 HISTORY — DX: Bipolar disorder, unspecified: F31.9

## 2015-06-28 HISTORY — DX: Headache, unspecified: R51.9

## 2015-06-28 LAB — POCT PREGNANCY, URINE: Preg Test, Ur: NEGATIVE

## 2015-06-28 SURGERY — ESOPHAGOGASTRODUODENOSCOPY (EGD) WITH PROPOFOL
Anesthesia: General

## 2015-06-28 MED ORDER — PROPOFOL 500 MG/50ML IV EMUL
INTRAVENOUS | Status: DC | PRN
  Administered 2015-06-28: 170 ug/kg/min via INTRAVENOUS

## 2015-06-28 MED ORDER — SODIUM CHLORIDE 0.9 % IV SOLN
INTRAVENOUS | Status: DC
  Administered 2015-06-28: 1000 mL via INTRAVENOUS

## 2015-06-28 MED ORDER — FENTANYL CITRATE (PF) 100 MCG/2ML IJ SOLN
INTRAMUSCULAR | Status: DC | PRN
  Administered 2015-06-28: 50 ug via INTRAVENOUS

## 2015-06-28 MED ORDER — GLYCOPYRROLATE 0.2 MG/ML IJ SOLN
INTRAMUSCULAR | Status: DC | PRN
  Administered 2015-06-28: 0.3 mg via INTRAVENOUS

## 2015-06-28 MED ORDER — LIDOCAINE HCL (CARDIAC) 20 MG/ML IV SOLN
INTRAVENOUS | Status: DC | PRN
  Administered 2015-06-28: 100 mg via INTRAVENOUS

## 2015-06-28 MED ORDER — MIDAZOLAM HCL 2 MG/2ML IJ SOLN
INTRAMUSCULAR | Status: DC | PRN
  Administered 2015-06-28: 2 mg via INTRAVENOUS

## 2015-06-28 MED ORDER — PROPOFOL 10 MG/ML IV BOLUS
INTRAVENOUS | Status: DC | PRN
  Administered 2015-06-28: 60 mg via INTRAVENOUS
  Administered 2015-06-28: 40 mg via INTRAVENOUS

## 2015-06-28 NOTE — Transfer of Care (Signed)
Immediate Anesthesia Transfer of Care Note  Patient: Cassandra Carter  Procedure(s) Performed: Procedure(s): ESOPHAGOGASTRODUODENOSCOPY (EGD) WITH PROPOFOL (N/A)  Patient Location: PACU  Anesthesia Type:General  Level of Consciousness: awake  Airway & Oxygen Therapy: Patient Spontanous Breathing and Patient connected to nasal cannula oxygen  Post-op Assessment: Report given to RN and Post -op Vital signs reviewed and stable  Post vital signs: stable  Last Vitals:  Filed Vitals:   06/28/15 0910  BP: 113/81  Pulse: 79  Temp: 35.9 C  Resp: 18    Complications: No apparent anesthesia complications

## 2015-06-28 NOTE — Anesthesia Postprocedure Evaluation (Signed)
  Anesthesia Post-op Note  Patient: Cassandra Carter  Procedure(s) Performed: Procedure(s): ESOPHAGOGASTRODUODENOSCOPY (EGD) WITH PROPOFOL (N/A)  Anesthesia type:General  Patient location: PACU  Post pain: Pain level controlled  Post assessment: Post-op Vital signs reviewed, Patient's Cardiovascular Status Stable, Respiratory Function Stable, Patent Airway and No signs of Nausea or vomiting  Post vital signs: Reviewed and stable  Last Vitals:  Filed Vitals:   06/28/15 0920  BP: 112/77  Pulse: 76  Temp:   Resp: 16    Level of consciousness: awake, alert  and patient cooperative  Complications: No apparent anesthesia complications

## 2015-06-28 NOTE — Discharge Instructions (Signed)

## 2015-06-28 NOTE — Op Note (Signed)
[facility] Gastroenterology Patient Name: Cassandra CritchleyVictoria Carter Procedure Date: 06/28/2015 8:46 AM MRN: [id number] Account #: [id number] Date of Birth: [birth year] Admit Type: Outpatient Age: [age] [facility] ROOM 1 Gender: Female Note Status: Finalized Procedure:         Upper GI endoscopy Indications:       Dysphagia, Vomiting Patient Profile:   This is a [age] year old female. Providers:         Rhona RaiderMatthew G. Shelle Ironein, MD Referring MD:      Doralee AlbinoAarti K. Maryruth BunKapur, MD (Referring MD) Medicines:         Propofol per Anesthesia Complications:     No immediate complications. Procedure:         Pre-Anesthesia Assessment:                    - Prior to the procedure, a History and Physical was                     performed, and patient medications, allergies and                     sensitivities were reviewed. The patient's tolerance of                     previous anesthesia was reviewed.                    After obtaining informed consent, the endoscope was passed                     under direct vision. Throughout the procedure, the                     patient's blood pressure, pulse, and oxygen saturations                     were monitored continuously. The Endoscope was introduced                     through the mouth, and advanced to the second part of                     duodenum. The upper GI endoscopy was accomplished without                     difficulty. The patient tolerated the procedure well. Findings:      The esophagus was normal.      Scattered mild inflammation characterized by erythema was found in the       gastric body and in the gastric antrum. Biopsies were taken with a cold       forceps for histology.      The examined duodenum was normal.      Four biopsies were obtained with cold forceps for histology randomly in       the lower third of the esophagus.      Four biopsies were obtained with cold forceps for histology randomly in       the upper third of the  esophagus. Impression:        - Normal esophagus.                    - Gastritis. Biopsied.                    - Normal  examined duodenum.                    - Four biopsies were obtained in the lower third of the                     esophagus.                    - Four biopsies were obtained in the upper third of the                     esophagus. Recommendation:    - Observe patient in GI recovery unit.                    - High fiber diet.                    - Continue present medications.                    - Await pathology results.                    - If esophageal biopsies are non-diagnostic and dysphagia                     continues or worsens, perform esophgeal manometry.                    - The findings and recommendations were discussed with the                     patient.                    - The findings and recommendations were discussed with the                     patient's family.                    - Use Prilosec (omeprazole) 20 mg PO daily. Procedure Code(s): --- Professional ---                    [phone], Esophagogastroduodenoscopy, flexible, transoral;                     with biopsy, single or multiple CPT copyright 2014 American Medical Association. All rights reserved. The codes documented in this report are preliminary and upon coder review may  be revised to meet current compliance requirements. Kathalene Frames, MD 06/28/2015 9:05:30 AM This report has been signed electronically. Number of Addenda: 0 Note Initiated On: 06/28/2015 8:46 AM      [facility]

## 2015-06-28 NOTE — Anesthesia Preprocedure Evaluation (Signed)
Anesthesia Evaluation  Patient identified by MRN, date of birth, ID band Patient awake    Reviewed: Allergy & Precautions, NPO status , Patient's Chart, lab work & pertinent test results  History of Anesthesia Complications Negative for: history of anesthetic complications  Airway Mallampati: II       Dental  (+) Teeth Intact   Pulmonary neg pulmonary ROS, former smoker,           Cardiovascular hypertension, Pt. on medications      Neuro/Psych  Headaches, Seizures - (last months ago), Well Controlled,  Anxiety Depression Bipolar Disorder negative neurological ROS     GI/Hepatic negative GI ROS, Neg liver ROS, GERD  ,  Endo/Other  negative endocrine ROS  Renal/GU Renal disease (recurrent UTIs, reflux)     Musculoskeletal   Abdominal   Peds  Hematology negative hematology ROS (+)   Anesthesia Other Findings   Reproductive/Obstetrics                             Anesthesia Physical Anesthesia Plan  ASA: II  Anesthesia Plan: General   Post-op Pain Management:    Induction: Intravenous  Airway Management Planned: Nasal Cannula  Additional Equipment:   Intra-op Plan:   Post-operative Plan:   Informed Consent: I have reviewed the patients History and Physical, chart, labs and discussed the procedure including the risks, benefits and alternatives for the proposed anesthesia with the patient or authorized representative who has indicated his/her understanding and acceptance.     Plan Discussed with:   Anesthesia Plan Comments:         Anesthesia Quick Evaluation

## 2015-06-28 NOTE — H&P (Signed)
  Primary Care Physician:  Jerl MinaHEDRICK, JAMES, MD  Pre-Procedure History & Physical: HPI:  Cassandra Carter is a 34 y.o. female is here for an endoscopy.   Past Medical History  Diagnosis Date  . Hypertension   . Depression   . History of miscarriage June 2011    1st trimester  . Vesicoureteral reflux with resulting kidney disease, unilateral   . Headache   . Bipolar disorder (HCC)   . Shortness of breath dyspnea   . Anxiety     History reviewed. No pertinent past surgical history.  Prior to Admission medications   Medication Sig Start Date End Date Taking? Authorizing Provider  valsartan-hydrochlorothiazide (DIOVAN-HCT) 320-25 MG per tablet TAKE ONE TABLET BY MOUTH ONCE DAILY 03/01/15  Yes Historical Provider, MD  zolpidem (AMBIEN) 5 MG tablet Take 5 mg by mouth at bedtime as needed for sleep.   Yes Historical Provider, MD  ALPRAZolam Prudy Feeler(XANAX) 0.5 MG tablet Take by mouth.    Historical Provider, MD  cyanocobalamin (,VITAMIN B-12,) 1000 MCG/ML injection Give injection once a week for 3 weeks then once a month 03/16/15   Historical Provider, MD  Cyanocobalamin (RA VITAMIN B-12 TR) 1000 MCG TBCR Take by mouth.    Historical Provider, MD  etonogestrel-ethinyl estradiol (NUVARING) 0.12-0.015 MG/24HR vaginal ring Place vaginally. 01/11/15 01/11/16  Historical Provider, MD  fluticasone (FLONASE) 50 MCG/ACT nasal spray Place 1 spray into both nostrils daily.    Historical Provider, MD  furosemide (LASIX) 20 MG tablet Take by mouth. 04/15/15 04/14/16  Historical Provider, MD  labetalol (NORMODYNE) 100 MG tablet Take 100 mg by mouth 2 (two) times daily.      Historical Provider, MD  LamoTRIgine 50 MG TB24 Take by mouth.    Historical Provider, MD  Prenatal Vit-Fe Fumarate-FA (PRENATAL COMPLETE PO) Take by mouth daily.      Historical Provider, MD  traZODone (DESYREL) 50 MG tablet Take by mouth.    Historical Provider, MD    Allergies as of 05/04/2015  . (No Known Allergies)    Family History  Problem  Relation Age of Onset  . Hypertension Mother     Social History   Social History  . Marital Status: Married    Spouse Name: N/A  . Number of Children: 1  . Years of Education: N/A   Occupational History  . Retail    Social History Main Topics  . Smoking status: Former Smoker    Quit date: 09/17/2011  . Smokeless tobacco: Never Used  . Alcohol Use: No  . Drug Use: No  . Sexual Activity: Not on file   Other Topics Concern  . Not on file   Social History Narrative     Physical Exam: BP 130/86 mmHg  Pulse 83  Temp(Src) 97.3 F (36.3 C) (Tympanic)  Resp 18  SpO2 99% General:   Alert,  pleasant and cooperative in NAD Head:  Normocephalic and atraumatic. Neck:  Supple; no masses or thyromegaly. Lungs:  Clear throughout to auscultation.    Heart:  Regular rate and rhythm. Abdomen:  Soft, nontender and nondistended. Normal bowel sounds, without guarding, and without rebound.   Neurologic:  Alert and  oriented x4;  grossly normal neurologically.  Impression/Plan: Cassandra Carter is here for an endoscopy to be performed for dysphagia, bulemia  Risks, benefits, limitations, and alternatives regarding  endoscopy have been reviewed with the patient.  Questions have been answered.  All parties agreeable.   Elnita MaxwellEIN, Hanifa Antonetti GORDON, MD  06/28/2015, 8:45 AM

## 2015-06-29 ENCOUNTER — Encounter (INDEPENDENT_AMBULATORY_CARE_PROVIDER_SITE_OTHER): Payer: BLUE CROSS/BLUE SHIELD | Admitting: Dietician

## 2015-06-29 ENCOUNTER — Encounter: Payer: Self-pay | Admitting: Dietician

## 2015-06-29 VITALS — Ht 63.0 in | Wt 221.9 lb

## 2015-06-29 DIAGNOSIS — E669 Obesity, unspecified: Secondary | ICD-10-CM

## 2015-06-29 DIAGNOSIS — F509 Eating disorder, unspecified: Secondary | ICD-10-CM

## 2015-06-29 LAB — SURGICAL PATHOLOGY

## 2015-06-29 NOTE — Progress Notes (Signed)
Medical Nutrition Therapy Follow-up visit:  Time:9:00am to 9:45am Visit #: 5 ASSESSMENT:  Diagnosis:obesity, eating disorder  Current weight: 221.9 lbs    Height: 63 in Medications: See list Medical History: hypertension, kidney disease Progress and evaluation: Patient reports she has not purged since previous visit which totals 3 1/2 weeks without purging. She was disappointed that she has gained 3.5 lbs since previous visit but states she was not surprised; "I have been off track". She states she lost her food record notebook and has not been recording her food intake. She reports she had an endoscopy 06/28/15 and there were no obvious problems but some routine biopsies were taken. She reports her job is a main source of stress right now and longer work hours have affected her meal schedule.  Physical activity:none   NUTRITION CARE EDUCATION: Basic nutrition:  Since planning meals seems to be overwhelming, asked her today as part of visit to make a list of possible meals that she would realistically prepare and eat. Since most of the meals were meat/starch asked her to add one other food group. As in previous visits, discussed additional simple meals that she could prepare for dinner with minimal preparation.  Hypertension:  So as not to overwhelm and in response to patient's question, briefly discussed sodium and dietary recommendation for sodium. Will focus on this more at next visit.   INTERVENTION:  Continue with previous goals with emphasis on goals of planning dinner meals and finding an online  exercise video. Previous goals: Continue with 3 meals and spaced 4-6 hours apart. Continue to work on increasing vegetables especially non-starchy vegetables. Continue to balance meals with protein, starch (rice, beans potatoes, corn, etc.) and non-starchy vegetables. 1 cup of any starchy food is a good portion at meals. Plan out dinner meals. Ride on bike working up to 3 times per  week.(This goal has been more difficult due to child activities and getting dark earlier). Reset goal of more exercise by finding online exercise video). If hungry at bedtime, include 1 serving of carbohydrate and 1 oz. of protein   EDUCATION MATERIALS GIVEN:  . Goals/ instructions LEARNER/ who was taught:  . Patient   LEVEL OF UNDERSTANDING: . Partial understanding; needs review/ practice  LEARNING BARRIERS: . None  WILLINGNESS TO LEARN/READINESS FOR CHANGE: . Eager, change in progress  MONITORING AND EVALUATION:  07/08/15

## 2015-06-29 NOTE — Patient Instructions (Signed)
Continue with previous goals with emphasis on goals of planning dinner meals and finding an online  exercise video. Previous goals: Continue with 3 meals and spaced 4-6 hours apart. Continue to work on increasing vegetables especially non-starchy vegetables. Continue to balance meals with protein, starch (rice, beans potatoes, corn, etc.) and non-starchy vegetables. 1 cup of any starchy food is a good portion at meals. Plan out dinner meals. Ride on bike working up to 3 times per week.(This goal has been more difficult due to child activities and getting dark earlier). Reset goal of more exercise by finding online exercise video). If hungry at bedtime, include 1 serving of carbohydrate and 1 oz. of protein

## 2015-06-30 ENCOUNTER — Encounter: Payer: Self-pay | Admitting: Gastroenterology

## 2015-07-08 ENCOUNTER — Encounter: Payer: Self-pay | Admitting: Dietician

## 2015-07-08 ENCOUNTER — Encounter (INDEPENDENT_AMBULATORY_CARE_PROVIDER_SITE_OTHER): Payer: BLUE CROSS/BLUE SHIELD | Admitting: Dietician

## 2015-07-08 VITALS — Ht 63.0 in | Wt 217.7 lb

## 2015-07-08 DIAGNOSIS — E669 Obesity, unspecified: Secondary | ICD-10-CM | POA: Diagnosis not present

## 2015-07-08 DIAGNOSIS — F509 Eating disorder, unspecified: Secondary | ICD-10-CM | POA: Diagnosis not present

## 2015-07-08 NOTE — Patient Instructions (Signed)
To continue with previous goals: Plan dinner meals ahead of time. 3 meals per day spaced 4-6 hours apart. Continue to increase vegetables and fruits with ideal goal of 5 servings per day. This will also help to increase potassium intake. Continue with plate method of balancing protein foods, starch and non-starchy vegetables. Continue with exercise video with goal of 20 minute, 3 times per week. Rinse canned vegetables. Use fresh or frozen when possible or try no salt added canned vegetables. Refer  to list of high sodium foods and try  low-sodium alternatives. Read labels for sodium.

## 2015-07-08 NOTE — Progress Notes (Signed)
Medical Nutrition Therapy Follow-up visit:  Time: 8:40- 9:20  Visit #:6 ASSESSMENT:  Diagnosis:obesity, eating disorder  Current weight:217.7 lbs    Height:63 in Medications: See list Medical History: hypertension, kidney disease Progress and evaluation: Patient reports she has been able to plan meals better, record her food/beverage intake and begin more structured exercise. The scale indicates a weight loss of 4.2 lbs in past week. Patient denies any form of purging. Her food records indicate a more consistent pattern of eating with 3 small meals plus 2-3 small snack. She is also including fruits on a daily basis and she increased her intake and variety of vegetables.Also has adequate but not excessive protein intake. Regarding sodium, patient states that she does not salt food in cooking or at the table with exception of tomatoes. She is now looking for food options that are reduced sodium.  Physical activity:She did search and find some exercise activities on-line and began doing these for 20 minutes 2 days ago.   NUTRITION CARE EDUCATION: Basic nutrition: Reviewed food records with patient commending on more consistent meal schedule and more variety of foods eaten. Commended especially the planning ahead of some of her meals so as to have foods available to prepare. Hypertension:  Gave and reviewed list of hight sodium foods and alternative lower sodium choices. Discussed how lowering sodium in the diet is a process and discussed it's role both in hypertension and kidney disease.   INTERVENTION:  To continue with previous goals: Plan dinner meals ahead of time. 3 meals per day spaced 4-6 hours apart. Continue to increase vegetables and fruits with ideal goal of 5 servings per day. This will also help to increase potassium intake. Continue with plate method of balancing protein foods, starch and non-starchy vegetables. Continue with exercise video with goal of 20 minute, 3 times per  week. In addition: Rinse canned vegetables. Use fresh or frozen when possible or try no salt added canned vegetables. Refer  to list of high sodium foods and try  low-sodium alternatives. Read labels for sodium.   EDUCATION MATERIALS GIVEN:  . General diet guidelines for Hypertension . List of high sodium foods and alternative lower sodium choices . Goals/ instructions  LEARNER/ who was taught:  . Patient   LEVEL OF UNDERSTANDING: . Verbalizes/ demonstrates competency LEARNING BARRIERS: . None  WILLINGNESS TO LEARN/READINESS FOR CHANGE: . Eager, change in progress MONITORING AND EVALUATION:  Diet, exercise, weight, 07/15/15 at 1:15pm

## 2015-07-15 ENCOUNTER — Encounter: Payer: Self-pay | Admitting: Dietician

## 2015-07-15 ENCOUNTER — Encounter: Payer: BLUE CROSS/BLUE SHIELD | Attending: Psychiatry | Admitting: Dietician

## 2015-07-15 VITALS — Ht 63.0 in | Wt 218.4 lb

## 2015-07-15 DIAGNOSIS — F509 Eating disorder, unspecified: Secondary | ICD-10-CM

## 2015-07-15 DIAGNOSIS — E669 Obesity, unspecified: Secondary | ICD-10-CM | POA: Diagnosis not present

## 2015-07-15 NOTE — Progress Notes (Signed)
Medical Nutrition Therapy Follow-up visit:  Time:13:15-13:45 Visit #:7 ASSESSMENT:  Diagnosis:obesity, eating disorder  Current weight:218.4 lbs    Height:63 in Medications: See list Medical History: hypertension, kidney disease Progress and evaluation:Patient reports her meal schedule has not been quite as consistent as at previous visit. She has skipped breakfast on several mornings. Otherwise, her food records indicate 3 meals per day with 1-2 snacks. She reports that her 7110 yr. old son broke his knee and she has to help him more in the mornings and evenings which has made finding time to exercise more difficult. Her weight since last week was relatively stable with a .7 lb gain. Her food records indicate she continues to balance many meals with protein, starch, non-starchy vegetable and sometimes fruit. She planned some of her evening meals.  She reports no purging in past week. She continues to see her therapist weekly.  Physical activity:none   NUTRITION CARE EDUCATION: Basic nutrition: Reviewed food records with patient commending on her continued efforts to work on previous diet goals set despite son's knee injury. Reviewed each goal and her progress with that goal.   INTERVENTION: To continue with previous goals set.  EDUCATION MATERIALS GIVEN:  . Goals/ instructions LEARNER/ who was taught:  . Patient   LEVEL OF UNDERSTANDING: . Verbalizes/ demonstrates competency LEARNING BARRIERS: . None  WILLINGNESS TO LEARN/READINESS FOR CHANGE: . Eager, change in progress  MONITORING AND EVALUATION:  07/23/15 at 8:15am

## 2015-07-15 NOTE — Patient Instructions (Signed)
Continue with previous goals set.

## 2015-07-23 ENCOUNTER — Encounter (INDEPENDENT_AMBULATORY_CARE_PROVIDER_SITE_OTHER): Payer: BLUE CROSS/BLUE SHIELD | Admitting: Dietician

## 2015-07-23 ENCOUNTER — Encounter: Payer: Self-pay | Admitting: Dietician

## 2015-07-23 VITALS — Ht 63.0 in | Wt 218.1 lb

## 2015-07-23 DIAGNOSIS — E669 Obesity, unspecified: Secondary | ICD-10-CM | POA: Diagnosis not present

## 2015-07-23 DIAGNOSIS — F509 Eating disorder, unspecified: Secondary | ICD-10-CM | POA: Diagnosis not present

## 2015-07-23 NOTE — Progress Notes (Signed)
Medical Nutrition Therapy Follow-up visit:  Time spent with patient: 8:15am-8:45  Visit #:8 ASSESSMENT:  Diagnosis:obesity, eating disorder  Current weight: 218.1 lbs    Height: 63 in Medications: See list Medical History: hypertension, kidney disease Progress and evaluation: Patient's weight remained stable in past week and she reports no purging. She kept food records on most days and she states that this helps her with keeping meal schedule consistent with 3 meals per day vs. skipping meals. She also planned out her evening meals this past week which she feels saves time and money. Her food records indicate a continued effort to add a fruit or vegetable to some meals rather than having just the starch and meat.  Physical activity:She states she has not been consistent. She walked for exercise 2 days for 15 minutes in past week. "By the time we eat dinner and I help my son (has brace on due to broken knee cap) I'm just too tired".  NUTRITION CARE EDUCATION: Basic nutrition:Reviewed food records with patient commending on days with consistent meal schedule of 3 meals per day. As with each visit, discussed strategies for healthy eating verses "will power" pointing out examples from her food records such as snack pack of gold fish vs. eating from large bag or portioned ice cream sandwich vs.eating from 1/2 gallon container.  INTERVENTION:  To "march in place" during the commercials when watching TV. To continue to keep food records. To include at least 3 food groups with meals. To continue with 3 meals per day. To continue to plan evening meals.   EDUCATION MATERIALS GIVEN:  . Goals/ instructions  LEARNER/ who was taught:  . Patient  LEVEL OF UNDERSTANDING: . Partial understanding; needs review/ practice LEARNING BARRIERS: . None  WILLINGNESS TO LEARN/READINESS FOR CHANGE: . Eager, change in progress  MONITORING AND EVALUATION: diet, exercise, weight  Follow-up 08/03/15 at  11:30am

## 2015-07-23 NOTE — Patient Instructions (Signed)
To "march in place" during the commercials when watching TV. To continue to keep food records. To include at least 3 food groups with meals. To continue with 3 meals per day. To continue to plan evening meals.

## 2015-08-03 ENCOUNTER — Encounter (INDEPENDENT_AMBULATORY_CARE_PROVIDER_SITE_OTHER): Payer: BLUE CROSS/BLUE SHIELD | Admitting: Dietician

## 2015-08-03 ENCOUNTER — Encounter: Payer: Self-pay | Admitting: Dietician

## 2015-08-03 VITALS — Ht 63.0 in | Wt 220.4 lb

## 2015-08-03 DIAGNOSIS — F509 Eating disorder, unspecified: Secondary | ICD-10-CM | POA: Diagnosis not present

## 2015-08-03 DIAGNOSIS — E669 Obesity, unspecified: Secondary | ICD-10-CM

## 2015-08-03 NOTE — Progress Notes (Signed)
Medical Nutrition Therapy Follow-up visit:  Time with patient: 11:30-12:00 Visit #:9 ASSESSMENT:  Diagnosis:obesity, eating disorder  Current weight:220.4 lbs    Height: 63 in Medications: See list Medical History: hypertension, kidney disease Progress and evaluation: Patient reports she has experienced more stress with family this past week and has addressed this with her counselor. She reports a stronger desire to purge after eating this week but has not done so. She reports her food choices have not been as healthy. She continues to keep food records and some of her most recent meals have been higher in fat and sodium. She also has not done any structured exercise. She had a 2.3 gain in weight but was also wearing a coat and heavier shoes (vs. sandals).  NUTRITION CARE EDUCATION: Basic Nutrition: Discussed how not purging despite desire to do so represents progress. Reviewed food records with her pointing out the positive choices she has made despite feeling discouraged. Also, discussed strategies for meals/snacks for Thanksgiving holiday. Exercise: Discussed again finding exercise videos on line that she could do at her convenience and how consistent exercise will be a factor with weight loss success. Hypertension/kidney disease: Discussed some of the high sodium choices on her food record and lower sodium alternatives.  INTERVENTION:  Patient states that she will use videos to exercise. To continue with previous goals.  EDUCATION MATERIALS GIVEN:  . Goals/ instructions  LEARNER/ who was taught:  . Patient   LEVEL OF UNDERSTANDING: . Verbalizes/ demonstrates competency LEARNING BARRIERS: . None WILLINGNESS TO LEARN/READINESS FOR CHANGE: . Eager, change in progress  MONITORING AND EVALUATION:  08/17/15, 9:00am

## 2015-08-03 NOTE — Patient Instructions (Signed)
Patient states that she will use videos to exercise. To continue with previous goals.

## 2015-08-17 ENCOUNTER — Encounter: Payer: BLUE CROSS/BLUE SHIELD | Attending: Psychiatry | Admitting: Dietician

## 2015-08-17 VITALS — Ht 63.0 in | Wt 218.8 lb

## 2015-08-17 DIAGNOSIS — F509 Eating disorder, unspecified: Secondary | ICD-10-CM | POA: Diagnosis not present

## 2015-08-17 DIAGNOSIS — E669 Obesity, unspecified: Secondary | ICD-10-CM

## 2015-08-17 NOTE — Patient Instructions (Signed)
Continue to drink water as main beverage. Exercise goal: 4 days per week Continue with consistent meal pattern.  Limit to just one evening snack. Ex. Sandwich,  raw vegetables

## 2015-08-17 NOTE — Progress Notes (Signed)
Medical Nutrition Therapy Follow-up visit:  Time with patient: 8:20-9:05am Visit #:10 ASSESSMENT:  Diagnosis:obesity, eating disorder  Current weight:218.8 lbs    Height:63 in Medications: See list Medical History:hypertension, kidney disease Progress and evaluation: Patient's weight is 1.6 lbs less than 2 weeks ago. She reports no purging. She states that she has switched to water only with exception of milk and some low sodium V-8 juice. She states, "I'm hungry all of the time and so I'm eating". When asked about her food recall from yesterday, she reports a protein bar at breakfast, a lean cuisine at lunch and pork chop, 2 biscuits and sauteed squash for dinner. She then ate cheese slices and a bologna and cheese sandwich for a snack and stated "I know that was bad". She agreed that the evenings are when she is most tempted to continue eating.  Physical activity:exercise video 2 times in past week for 20 minutes  NUTRITION CARE EDUCATION: Basic nutrition:  Discussed how breaking pattern of purging is a priority and her success with this. Also discussed her success of maintaining a relatively stable weight. Commended on effort to eliminate soda.Discussed again how balancing breakfast and lunch with carbohydrate, protein and fat could help avoid excessive hunger at dinner which can often  lead to continued eating after dinner. Reminded patient that she stated that she is not tempted to keep eating snacks during the day because she is busy at work and encouraged to think of tasks/hobbies she can accomplish after dinner.  Exercise: Discussed how this can be one of the things to accomplish after dinner. INTERVENTION:  Continue to drink water as main beverage. Exercise goal: 4 days per week Continue with consistent meal pattern.    Focus on what you can add to breakfast and lunch so as not to be so hungry at dinner. Refer to suggestions. Limit to just one evening snack. Ex. Sandwich,  raw  vegetables (bologna sandwich is a favorite). Make a list of 1-2 things that you want to accomplish after dinner to help break pattern of focusing on food.  EDUCATION MATERIALS GIVEN:  . Goals/ instructions  LEARNER/ who was taught:  . Patient   LEVEL OF UNDERSTANDING: . Partial understanding; needs review/ practice LEARNING BARRIERS: . None  WILLINGNESS TO LEARN/READINESS FOR CHANGE: . Eager, change in progress . Non-acceptance, not ready for change  MONITORING AND EVALUATION:  Follow-up 09/14/2015

## 2015-09-01 ENCOUNTER — Ambulatory Visit: Payer: BLUE CROSS/BLUE SHIELD | Admitting: Dietician

## 2015-09-14 ENCOUNTER — Ambulatory Visit: Payer: BLUE CROSS/BLUE SHIELD | Admitting: Dietician

## 2015-09-17 ENCOUNTER — Encounter: Payer: BLUE CROSS/BLUE SHIELD | Attending: Psychiatry | Admitting: Dietician

## 2015-09-17 VITALS — Ht 63.0 in | Wt 217.8 lb

## 2015-09-17 DIAGNOSIS — E669 Obesity, unspecified: Secondary | ICD-10-CM | POA: Insufficient documentation

## 2015-09-17 DIAGNOSIS — F509 Eating disorder, unspecified: Secondary | ICD-10-CM | POA: Insufficient documentation

## 2015-09-17 NOTE — Patient Instructions (Signed)
Continue with exercise right after work since that time has been easier to be more consistent. Continue with consistent meal pattern of 3 meals and 1 snack between meals. Strive for 64 oz water per day.

## 2015-09-17 NOTE — Progress Notes (Signed)
Medical Nutrition Therapy Follow-up visit:  Time with patient: 8:15am-8:45am Visit #:11 ASSESSMENT:  Diagnosis:obesity, eating disorder  Current weight:217.8 lbs    Height:63 in Medications: See list Medical History:kidney disease Progress and evaluation: Patient stated that she feels her present pattern of eating "has leveled off" and feels more normal in past few weeks. She states she is consistently eating 3 meals and 3 snacks. She states that eating an afternoon snack is helping her to control portions better at her dinner meal.She reports no binging or purging. Her main beverage is water and she drinks approximatel 3 (16oz) bottles per day. She drinks a regular soda 1-2 times per week when eating "out". She states she is not buying any sodas for home. She also reports that after dinner both she and her son wait at least 1 hour before eating a snack. She has bought portioned packages of cookies (3 oreos to a pack) and finds this helps her limit her snack. She had been exercising 2 days/week for 20 minutes and this week has added a exercise video. She finds that if she exercises as soon as she comes home from work she is much more consistent than if she waits until later. She also states that she is trying to stay busier in the evenings to avoid thinking about eating and it seems to be working.   NUTRITION CARE EDUCATION: Basic nutrition: Commended patient on her continued effort and success with working toward a more normal pattern of eating. Emphasized again how her strategies such as exercising after work and buying portioned snacks and only having water as beverage available at home is working verses a restrictive diet of do's and don'ts. Encouraged again to continue focus on what can be added to diet to add nutritive value such as fruits and vegetables verses what needs to be taken away. INTERVENTION:  Continue with exercise right after work since that time has been easier to be more  consistent. Continue with consistent meal pattern of 3 meals and 1 snack between meals. Strive for 64 oz water per day.   EDUCATION MATERIALS GIVEN:  . Goals/ instructions  LEARNER/ who was taught:  . Patient   LEVEL OF UNDERSTANDING: . Verbalizes/ demonstrates competency . Not applicable LEARNING BARRIERS: . None WILLINGNESS TO LEARN/READINESS FOR CHANGE: . Eager, change in progress  MONITORING AND EVALUATION:  Follow-up 10/08/15 at 8:15am

## 2015-10-08 ENCOUNTER — Encounter (INDEPENDENT_AMBULATORY_CARE_PROVIDER_SITE_OTHER): Payer: BLUE CROSS/BLUE SHIELD | Admitting: Dietician

## 2015-10-08 VITALS — Ht 63.0 in | Wt 219.2 lb

## 2015-10-08 DIAGNOSIS — E669 Obesity, unspecified: Secondary | ICD-10-CM

## 2015-10-08 DIAGNOSIS — F509 Eating disorder, unspecified: Secondary | ICD-10-CM

## 2015-10-08 NOTE — Progress Notes (Signed)
Medical Nutrition Therapy Follow-up visit:  Time with patient: 8:15am-8:45am Visit #:12 ASSESSMENT:  Diagnosis:obesity, eating disorder  Current weight: 219.2 lbs    Height:63 in Medications: See list Medical History: kidney diseas Progress and evaluation: Patients states that she has maintained to positive strategies she reported at her last visit: consistent pattern of eating, includes an afternoon snack to help decrease hunger at meal time, eats portioned snacks such as snack packs of granola bites or goldfish, waits 1 hour after dinner before eating a small snack and exercises right after work rather than waiting and often then choosing not to do it. She gave other examples such as eating cucumbers along with pizza which helped her to limit pizza to one slice and she states she was satisfied. Another example - She ate 2 cookies for her evening snack and then ate carrots just before bed rather than more cookies.  She does report that she has experienced more back and leg pain in the past week which has limited intensity of cardio exercise. She states that she did go to [place] a couple of evenings just to walk around rather than sit. She also has continued to try to stay busier in the evening to avoid thinking about snacks. She enjoys "Agricultural engineer. She reports no binging or purging since previous visit.  NUTRITION CARE EDUCATION: Basic nutrition: Reviewed previous strategies patient has developed and commended on her continued progress with these strategies. Discussed her progress with mindful eating.  INTERVENTION:  Continue with consistent meal pattern of 3 meals and 1-2 small snacks. Continue to strive for goal 3 (16oz) bottles of water per day. Exercise goal: 3 times per week for 20 minutes. Write down goals for evening  since staying busy has helped decrease snacking.  EDUCATION MATERIALS GIVEN:  . Goals/ instructions LEARNER/ who was taught:  . Patient  LEVEL OF  UNDERSTANDING: . Verbalizes/ demonstrates competency LEARNING BARRIERS: . None   MONITORING AND EVALUATION:  Follow-up- 10/29/15 at 8:00am

## 2015-10-08 NOTE — Patient Instructions (Signed)
Continue with consistent meal pattern of 3 meals and 1-2 small snacks. Continue to strive for goal 3 (16oz) bottles of water per day. Exercise goal: 3 times per week for 20 minutes. Write down goals for evening  since staying busy has helped decrease snacking.

## 2015-10-29 ENCOUNTER — Encounter: Payer: BLUE CROSS/BLUE SHIELD | Attending: Psychiatry | Admitting: Dietician

## 2015-10-29 VITALS — Ht 63.0 in | Wt 218.2 lb

## 2015-10-29 DIAGNOSIS — E669 Obesity, unspecified: Secondary | ICD-10-CM | POA: Insufficient documentation

## 2015-10-29 DIAGNOSIS — F509 Eating disorder, unspecified: Secondary | ICD-10-CM | POA: Insufficient documentation

## 2015-10-29 NOTE — Patient Instructions (Signed)
Continue with previous goals set. Ok to continue using myfitnesspal app if exceeding the calories recommended does not lead to binging/purging. Continue to read labels for sodium with goal of no more than 1500 mg per day.  Exercise goal: 20 minutes per day; either exercise video or walking

## 2015-10-29 NOTE — Progress Notes (Signed)
Medical Nutrition Therapy Follow-up visit:  Time with patient: 8:15-8:45 Visit #:13 ASSESSMENT:  Diagnosis: obesity; eating disorder (bulimia)   Current weight:218.2 lbs    Height:63 in Medications: See list Medical History: hypertension, kidney disease Progress and evaluation: Patient has followed through with goals of consistent meal pattern of 3 meals and 1-2 snacks. She states she has been using myfitnesspal app to track food and beverage intake. She feels this can be a tool to help her make better decisions regarding food choices and at this point, has not increased urge to purge after exceeding calories suggested. Ex. of positive choices since previous visit: limits fast food lunch choices to 1 x in 2 weeks and 1x per week for dinner meals. Continues to drink at least 3 (16oz) bottles of water per day, is exercising for 20 minutes daily (this is an increase from 3 days per week). She states she tried no salt added canned green beans and loves them. She is being more aware of her sodium intake.  She reports no binging and purging since previous visit. She weighs 1 lb less than at previous visit.   NUTRITION CARE EDUCATION: Basic Nutrition: Explained that  with her history of binging/purging, I have focused more on more normal pattern of eating without focus on calories.Discussed how myfitnesspal can be a helpful tool in monitoring food/beverage intake if it doesn't lead to more stress and previous purging tendencies if exceed calories recommended. Obtained 24 hour food recall which indicated 3 meals and 2 snacks. Reviewed the many positive diet/beverage and exercise changes patient has made in 5 months since initial visit.  Hypertension: Encouraged if using myfitness pal to look at sodium content of food choices. Discussed again  recommendation ideally of no more than  sodium per day. Reviewed again ways to help meet this goal.  INTERVENTION:  Continue with previous goals set. Ok to  continue using myfitnesspal app if exceeding the calories recommended does not lead to binging/purging. Continue to read labels for sodium with goal of no more than 1500 mg per day.  Exercise goal: 20 minutes per day; either exercise video or walking EDUCATION MATERIALS GIVEN:  . Goals/ instructions  LEARNER/ who was taught:  . Patient  LEVEL OF UNDERSTANDING: . Verbalizes/ demonstrates competency LEARNING BARRIERS: . None WILLINGNESS TO LEARN/READINESS FOR CHANGE: . Eager, change in progress MONITORING AND EVALUATION: follow-up 11/19/15 at 8:15am

## 2015-11-19 ENCOUNTER — Encounter: Payer: Self-pay | Admitting: Dietician

## 2015-11-19 ENCOUNTER — Encounter: Payer: BLUE CROSS/BLUE SHIELD | Attending: Psychiatry | Admitting: Dietician

## 2015-11-19 VITALS — Ht 63.0 in | Wt 216.3 lb

## 2015-11-19 DIAGNOSIS — F509 Eating disorder, unspecified: Secondary | ICD-10-CM | POA: Insufficient documentation

## 2015-11-19 DIAGNOSIS — E669 Obesity, unspecified: Secondary | ICD-10-CM | POA: Insufficient documentation

## 2015-11-19 NOTE — Patient Instructions (Signed)
Mix unflavored oatmeal with flavored. Take a fruit with lunch or keep several at work to add to lunch brought from home. Continue with consistent pattern of 3 meals and 2 snacks.

## 2015-11-19 NOTE — Progress Notes (Signed)
Medical Nutrition Therapy Follow-up visit:  Time with patient:  8:25am -9:00am Visit #:14 ASSESSMENT:  Diagnosis:obesity, eating disorder (bulimia)  Current weight:216.3 lbs    Height: 63 in Medications: See list Medical History: hypertension, kidney disease, bipolar Progress and evaluation:  Patient in for medical nutrition therapy follow-up. She reports she has had increased stress in past 2 weeks increasing her urge to purge but she has not done so. She states, "I've had to fight it". She has been including a 20oz Coke on most days and she states she tends to do this when she is stressed. She stopped using myfitness pal since she felt knowing the calories was adding to her stress which was a concern I had expressed to her at the previous visit. She is continuing to eat 3 meals per day and 2 snacks. She gave the following typical meal pattern and choices: 8:00- flavored oatmeal (2 pkgs) or cereal or oatmeal bar, water 11:30: lean cuisine for healthy choice meal or leftovers; also eats a granola bar Snack: package of "fruit snacks" or small package of goldfish crackers; 6:30pm Dinner: - Ex. Grilled or b'bque chcken, starch, vegetable or salad. She states she has been eating raw vegetables with ranch dip for evening snack. Her weight today was 2 lbs less than weight 3 weeks ago.  Physical activity: no structured exercise but she states she is "making a point" to keep moving in the evening rather than sitting. She states that she can tell she has more energy longer in the day if she will do this.   NUTRITION CARE EDUCATION: Basic nutrition: Commended on recognizing that using phone app may be putting pressure on her to skip a meal or purge if calories were higher than recommended on app.  Commended also that she has continued goal set of 3 meals daily with 1-2 snacks and reviewed present pattern of meals and food choices. Emphasized again that maintaining this more normal pattern on eating, both in  meal timing and food choices is an important change that she made and has been able to maintain.   INTERVENTION:  Patient felt she could be successful with the following goals: Mix unflavored oatmeal with flavored. Take a fruit with lunch or keep several at work to add to lunch brought from home. Continue with consistent pattern of 3 meals and 2 snacks.  EDUCATION MATERIALS GIVEN:  . Goals/ instructions LEARNER/ who was taught:  . Patient  LEVEL OF UNDERSTANDING: . Verbalizes/ demonstrates competency LEARNING BARRIERS: . None WILLINGNESS TO LEARN/READINESS FOR CHANGE: . Eager, change in progress MONITORING AND EVALUATION:  Diet/exercise  Follow-up- 12/17/15

## 2015-12-17 ENCOUNTER — Encounter: Payer: BLUE CROSS/BLUE SHIELD | Attending: Psychiatry | Admitting: Dietician

## 2015-12-17 ENCOUNTER — Encounter: Payer: Self-pay | Admitting: Dietician

## 2015-12-17 VITALS — Ht 63.0 in | Wt 221.0 lb

## 2015-12-17 DIAGNOSIS — E669 Obesity, unspecified: Secondary | ICD-10-CM | POA: Diagnosis not present

## 2015-12-17 DIAGNOSIS — F509 Eating disorder, unspecified: Secondary | ICD-10-CM

## 2015-12-17 NOTE — Progress Notes (Signed)
Medical Nutrition Therapy Follow-up visit:  Time with patient: 8:10-8:40 am Visit #:15 ASSESSMENT:  Diagnosis:obesity, eating disorder  Current weight:221 lbs    Height: 63 in Medications: See list Medical History: hypertension, kidney disease, bipolar Progress and evaluation: Patient in for medical nutrition therapy. She reports that she has continued to eat 3 meals per day. She states that some of her food choices are not as healthy. She has increased fast food choices at lunch, typically 3 days per week- Ex. [employer] cheeseburger, fries, Coke or 7" ham/cheese, lettuce, tomato sub, coke. Continues to eat most of her dinner meals at home with a meat, starch and vegetable. She feels one of the biggest changes is an increase in sweetened beverages including 20-40 oz.Coke per day, 16 oz. Apple juice and 16 oz. gaterade (3xper week). She reports a decrease in her water intake from 0-16 oz. She also reports she was sick with stomach virus 2 weeks ago and feels she is still feeling more fatigued as a result. She reports that she as not been intentional about keeping herself busy in the evenings and has been sitting more. She has experienced weight gain of 4.7 lbs in past month. She also reports that she has not purged in any form in the past month nor has she binged.  NUTRITION CARE EDUCATION: Basic nutrition: Commended on continuing a consistent pattern of 3 meals and 1-2 small snacks. Commended on awareness that increased sweetened beverages is most likely contributing to some of her weight gain and willingness to work to decrease in diet. Acknowledged that lunch needs to have more variety than just lower fat "frozen" dinners and discussed options. Patient acknowledged desire to increase vegetables again in her diet and discussed ways to do this. Hypertension/Kidney disease: Discussed how a good water intake is best beverage for her kidneys and explained how cola beverages can increase her phosphorus  intake which is not healthy for kidneys. Also, discouraged Gaterade due to sodium intake. Eating Disorder: Reminded patient that her priorities from the initial visit were to avoid purging and to establish a more normal pattern of eating and that she has continued to do this for at least 5 months.  INTERVENTION:  Increase water intake to help decrease decrease soda, juice. During work hours, include at least 1 fruit and 1 vegetable. Continue with 3 meals and 1-2 small snacks. Increase evening activities that helps to avoid sitting.   EDUCATION MATERIALS GIVEN:  . Goals/ instructions  LEARNER/ who was taught:  . Patient   LEVEL OF UNDERSTANDING: . Verbalizes/ demonstrates competency LEARNING BARRIERS: . None  WILLINGNESS TO LEARN/READINESS FOR CHANGE: . Eager, change in progress  MONITORING AND EVALUATION:  Follow-up; April 28th at 8:00am

## 2015-12-17 NOTE — Patient Instructions (Signed)
Increase water intake to help decrease decrease soda, juice. During work hours, include at least 1 fruit and 1 vegetable. Continue with 3 meals and 1-2 small snacks. Increase evening activities that helps to avoid sitting.

## 2016-01-07 ENCOUNTER — Encounter (INDEPENDENT_AMBULATORY_CARE_PROVIDER_SITE_OTHER): Payer: BLUE CROSS/BLUE SHIELD | Admitting: Dietician

## 2016-01-07 ENCOUNTER — Encounter: Payer: Self-pay | Admitting: Dietician

## 2016-01-07 VITALS — Ht 63.0 in | Wt 218.4 lb

## 2016-01-07 DIAGNOSIS — F509 Eating disorder, unspecified: Secondary | ICD-10-CM | POA: Diagnosis not present

## 2016-01-07 DIAGNOSIS — E669 Obesity, unspecified: Secondary | ICD-10-CM

## 2016-01-07 NOTE — Patient Instructions (Signed)
Increase variety in breakfast choices. Refer to "Quick and [employer]y Meals" handout. Try Ms. DASH seasonings. Continue to limit soda- no more than 20 oz. Per day with eventual goal of decreasing further. Continue with 3 meals and 1-2 snacks/day. Keep fruit and raw vegetables at work as snacks.

## 2016-01-07 NOTE — Progress Notes (Signed)
Medical Nutrition Therapy Follow-up visit:  Time with patient: 8:25-9:10am Visit #:16 ASSESSMENT:  Diagnosis:obesity, eating disorder  Current weight: 218.4 lbs    Height: 63 in Medications: See list Medical History: hypertension, kidney disease, bipolar Progress and evaluation: Patient in for medical nutrition therapy follow-up appointment. She reports she is limiting soda to 1 (20 oz), usually with the evening meal. She is drinking more water, usually 3 (16 oz) bottles per day. She continues to eat 3 meals per day. She denies binging or purging.She states that she needs some more ideas to add variety to breakfast meals. She states that it is working for her to eat a 7" ham/cheese sub at lunch and she has been drinking water instead of Coke. She reports most of her dinner meals are prepared at home and that her husband does most of the cooking and he is trying to include a protein food, starch and vegetable at most meals. She has recently been taking apple slices to work to eat as a snack. She states she has been eating a pack with 4 Reese's cups for an evening snack.  She has lost 2.6 lbs in the past month. Physical activity:none   NUTRITION CARE EDUCATION: Basic Nutrition: Commended on continuing consistent pattern of 3 meals and 1-2 snacks as well as decreasing intake of sweetened beverages and increasing water intake. Gave and reviewed breakfast suggestions. Also, discussed need to eat meals at breakfast and dinner that are lower in sodium especially sub sandwich at lunch is high in sodium. Discussed options such as using no salt added tomato sauce and tomatoes with basil, oregano and parsley added to make spaghetti sauces. Commended on taking apples for snacks.   INTERVENTION:  Increase variety in breakfast choices. Refer to "Quick and [employer]Healthy Meals" handout. Try Ms. DASH seasonings. Continue to limit soda- no more than 20 oz. Per day with eventual goal of decreasing further. Continue  with 3 meals and 1-2 snacks/day. Keep fruit and raw vegetables at work as snacks.   EDUCATION MATERIALS GIVEN:  . Recipes . Sample meal pattern/ menus "Quick and Healthy Meals"  LEARNER/ who was taught:  . Patient  LEVEL OF UNDERSTANDING: . Partial understanding; needs review/ practice LEARNING BARRIERS: . None  WILLINGNESS TO LEARN/READINESS FOR CHANGE: . Eager, change in progress  MONITORING AND EVALUATION:  Diet, exercise Follow-up: May 26 at 8:00am

## 2016-02-04 ENCOUNTER — Encounter: Payer: BLUE CROSS/BLUE SHIELD | Attending: Psychiatry | Admitting: Dietician

## 2016-02-04 ENCOUNTER — Encounter: Payer: Self-pay | Admitting: Dietician

## 2016-02-04 VITALS — Ht 63.0 in | Wt 220.7 lb

## 2016-02-04 DIAGNOSIS — F509 Eating disorder, unspecified: Secondary | ICD-10-CM | POA: Diagnosis not present

## 2016-02-04 DIAGNOSIS — E669 Obesity, unspecified: Secondary | ICD-10-CM | POA: Diagnosis not present

## 2016-02-04 NOTE — Progress Notes (Signed)
Medical Nutrition Therapy Follow-up visit:  Time with patient: 8:10-8:45 am Visit #:16 ASSESSMENT:  Diagnosis:obesity, eating disorder  Current weight: 220.7 lbs    Height: 63 in Medications: See list Medical History: hypertension, kidney disease, bipolar Progress and evaluation: Patient in for medical nutrition follow-up appointment. She reports that she continues to struggle with breakfast choices and even with suggestions discussed at her last visit, she doesn't have an appetite for most foods in the morning. She has been taking a large muffin for breakfast and states she feels satisfied until lunch when she eats a 7"ham/cheese sub with water. Most of her dinner meals are prepared at home with example from last evening- pork loin roast, roasted potatoes and sauteed squash. She adds no salt in cooking or at the table.She reports that she planned out the meals for this past week which made it easier. She drinks 1 (20) oz. Soda and her only other beverage is water- at least 3 (16oz) bottles/day. She reports no binging or purging. She has not done any structured exercise.   NUTRITION CARE EDUCATION: Basic nutrition: Commended patient on her continued success with eating 3 meals as well as her effort to plan more meals to be prepared at home as well as her success in limiting soda and drinking more water.  Exercise: Discussed options for exercise such as [place] at the [place] park early when her son has games and walking or walking while he has practice.  INTERVENTION:  Walk at ballpark when son has practice or arrive early to walk when he has games.  EDUCATION MATERIALS GIVEN:  . Goals/ instructions  LEARNER/ who was taught:  . Patient  LEVEL OF UNDERSTANDING: . Verbalizes/ demonstrates competency LEARNING BARRIERS: . None WILLINGNESS TO LEARN/READINESS FOR CHANGE: . Eager, change in progress MONITORING AND EVALUATION:  Diet, exercise, weight  Follow-up- 03/03/16 at 8:15am

## 2016-02-04 NOTE — Patient Instructions (Signed)
Walk at ballpark when son has practice or arrive early to walk when he has games.

## 2016-03-03 ENCOUNTER — Encounter: Payer: Self-pay | Admitting: Dietician

## 2016-03-03 ENCOUNTER — Encounter: Payer: BLUE CROSS/BLUE SHIELD | Attending: Psychiatry | Admitting: Dietician

## 2016-03-03 VITALS — Ht 63.0 in | Wt 222.4 lb

## 2016-03-03 DIAGNOSIS — E669 Obesity, unspecified: Secondary | ICD-10-CM | POA: Diagnosis not present

## 2016-03-03 DIAGNOSIS — F509 Eating disorder, unspecified: Secondary | ICD-10-CM | POA: Diagnosis not present

## 2016-03-03 NOTE — Patient Instructions (Signed)
Since not able to eat breakfast right now, drink a shake such as Slim Fast or [employer] Instant Breakfast. Look at some of the frozen dinners and try to choose those with less than 600mg  sodium. Add fruit or vegetables. Take for lunch. Talk with counselor regarding significant decrease in appetite to get her opinion if change in appetite is more related to sense of feeling overwhelmed or if seizure medication increase should be considered.

## 2016-03-06 NOTE — Progress Notes (Signed)
Medical Nutrition Therapy Follow-up visit:  Time with patient: 8:10- 8:55 Visit #: 17 ASSESSMENT:  Diagnosis:  Obesity, eating disorder  Current weight: 222.4 lbs    Height: 63 in Medications: See list Medical History: hypertension, kidney disease, bipolar Progress and evaluation: Patient in for medical nutrition therapy follow-up. She reports a decreased appetite. She states she has been skipping breakfast on some mornings and cannot eat before 10:00am. She reports she is eating 1/2 to 1 ham/cheese sandwich at lunch. Most of her evening meals are prepared at home except on evenings that her child has a ball game. Her main beverage is water and she continues to limit soda to 20 oz.per day. She denies binging, or purging with laxatives or vomiting. She reports that she does not have the energy to do any structured exercise. Her weight has increased by 1.7 lbs in past month. Her present diet is low in fruits, vegetables, fiber, whole grains and calcium sources. Question if increase in her lamotrigine medication could be affecting appetite, mood and energy level since onset of these symptoms correlates with increase in medication.   Physical activity:none   NUTRITION CARE EDUCATION: Basic nutrition:  Discussed importance of discussing with counselor change in mood, decreased energy level and appetite to get her opinion regarding whether since of feeling overwhelmed or medication change or both could be contributing. Focused on positives such as how many of her evening meals are prepared at home and limiting sweetened beverages to 16-20 oz per day.  Focused on some ideas for meals that require very little prep time during this time of decreased energy.  INTERVENTION:  Since not able to eat breakfast right now, drink a shake such as Slim Fast or [employer] Instant Breakfast. Benetta Spar(Quenesha felt that she could do this) Look at some of the frozen dinners and try to choose those with less than 600 mg  sodium. Add fruit or vegetables. Take for lunch. (Patient was considering nutri-system foods; suggested this as alternative and less costly). Talk with counselor regarding significant decrease in appetite to get her opinion if change in appetite is more related to sense of feeling overwhelmed or if seizure medication increase should be considered.  EDUCATION MATERIALS GIVEN:  . Goals/ instructions LEARNER/ who was taught:  . Patient   LEVEL OF UNDERSTANDING: . Partial understanding; needs review/ practice LEARNING BARRIERS: . None WILLINGNESS TO LEARN/READINESS FOR CHANGE: . Change in progress  MONITORING AND EVALUATION:  Diet/exercise. Follow-up 03/29/16 at 8:15.

## 2016-03-29 ENCOUNTER — Ambulatory Visit: Payer: BLUE CROSS/BLUE SHIELD | Admitting: Dietician

## 2016-04-28 ENCOUNTER — Encounter: Payer: BLUE CROSS/BLUE SHIELD | Attending: Psychiatry | Admitting: Dietician

## 2016-04-28 VITALS — Ht 63.0 in | Wt 222.2 lb

## 2016-04-28 DIAGNOSIS — E669 Obesity, unspecified: Secondary | ICD-10-CM | POA: Insufficient documentation

## 2016-04-28 DIAGNOSIS — F509 Eating disorder, unspecified: Secondary | ICD-10-CM | POA: Insufficient documentation

## 2016-04-28 NOTE — Patient Instructions (Signed)
[facility] letting them know that you have a coupon from Nutrition/Diabetes [employer]. Once you've eaten "sweet" snack in the evening if have desire for 2nd snack, choose fruit, yogurt or vegetables but best to limit to one snack per evening. Continue to look at labels for sodium content with ideal goal of no more than 1500mg  per day. Use calorieking.com to look up sodium content of foods eaten "out".

## 2016-04-28 NOTE — Progress Notes (Signed)
Medical Nutrition Therapy Follow-up visit:  Time with patient: 8:10-8:45 Visit #:8th visit this year ASSESSMENT:  Diagnosis:obesity, eating disorder  Current weight:222.2 lbs    Height:63 in Medications: See list Medical History: hypertension, kidney disease, bipolar Progress and evaluation: Patient in for medical nutrition therapy follow-up. Cassandra Carter became teary at onset of visit. She stated that she needs to lose weight and is concerned about her health problems mentioning specifically hypertension and kidney disease. She state that to her knowledge there has not been a change in these conditions. She stated that her next physical was scheduled for this month but her MD office called and rescheduled it for a month later. She states that she tries to exercise but then has pain "all over" and no one can find a medical reason for this. Her present diet is consistent with diet at last visit. She sometimes skips breakfast although is trying to eat at least a piece of fruit. She takes most of her lunch meals which usually consists of a sandwich and fruit. Most of her evening meals are prepared at home and is eating "out" or doing "take out" 0-1 time per week. Last evening for example she ate 1 chicken tender and 1 cup of grilled squash.  She continues to drink 1 (20 oz) soda in the evening and eats something sweet such as Reese's cup. 0-1 x per week she continues to eat snacks and gave example of popcorn then cheese then cereal. She denies purging with laxatives or vomiting. Patient's weight has remained stable in past 2 months.  Physical activity: none  NUTRITION CARE EDUCATION: Basic nutrition/Exercise:  Allowed patient to express her frustration regarding her weight and health concerns. Acknowledged her concerns and focused on the many steps she has already taken to improve diet/nutrition and the fact that she does not purge even after eating a larger meal or when she eats several snacks  after dinner. Reminded that a consistent pattern of meals and snacks and avoiding purging were the priorities and she has accomplished that. Discussed again how limiting sodium is an important dietary recommendation for both high blood pressure and kidney disease and reviewed ways to do this.  Made suggestion that she focus on physical activity to help improve her fitness and to feel that she is moving forward. Gave a coupon to [employer] to meet with an exercise specialist and use the equipment for 1 week without charge to see if there is a program that will better meet her needs.   INTERVENTION:  Call [facility] letting them know that you have a coupon from Nutrition/Diabetes Education. Once you've eaten "sweet" snack in the evening if have desire for 2nd snack, choose fruit, yogurt or vegetables but best to limit to one snack per evening. Continue to look at labels for sodium content with ideal goal of no more than 1500mg  per day. Use calorieking.com to look up sodium content of foods eaten "out".  EDUCATION MATERIALS GIVEN:  . Goals/ instructions  LEARNER/ who was taught:  . Patient  LEARNING BARRIERS: . None WILLINGNESS TO LEARN/READINESS FOR CHANGE: Change in Progress MONITORING AND EVALUATION:  05/26/16 at 8:15am

## 2016-05-19 ENCOUNTER — Ambulatory Visit: Payer: BLUE CROSS/BLUE SHIELD | Admitting: Podiatry

## 2016-05-23 ENCOUNTER — Ambulatory Visit: Payer: Self-pay

## 2016-05-23 ENCOUNTER — Ambulatory Visit (INDEPENDENT_AMBULATORY_CARE_PROVIDER_SITE_OTHER): Payer: BLUE CROSS/BLUE SHIELD

## 2016-05-23 ENCOUNTER — Encounter: Payer: Self-pay | Admitting: Podiatry

## 2016-05-23 ENCOUNTER — Ambulatory Visit (INDEPENDENT_AMBULATORY_CARE_PROVIDER_SITE_OTHER): Payer: BLUE CROSS/BLUE SHIELD | Admitting: Podiatry

## 2016-05-23 VITALS — BP 109/61 | HR 98 | Resp 16 | Ht 63.0 in | Wt 222.0 lb

## 2016-05-23 DIAGNOSIS — M79672 Pain in left foot: Secondary | ICD-10-CM

## 2016-05-23 DIAGNOSIS — M216X9 Other acquired deformities of unspecified foot: Secondary | ICD-10-CM | POA: Diagnosis not present

## 2016-05-23 DIAGNOSIS — M79671 Pain in right foot: Secondary | ICD-10-CM | POA: Diagnosis not present

## 2016-05-23 DIAGNOSIS — S92351A Displaced fracture of fifth metatarsal bone, right foot, initial encounter for closed fracture: Secondary | ICD-10-CM | POA: Diagnosis not present

## 2016-05-23 NOTE — Progress Notes (Signed)
Patient ID: Cassandra Carter, female   DOB: August 08, 1981, 35 y.o.   MRN: [id number] Subjective: Patient presents today with her husband for evaluation of a right foot fracture the patient states that she broke her foot approximately one week ago which time she was seen at the urgent care. Urgent care confirm the right foot fracture and referred her here to the clinic.  Patient states that she is always had painful feet bilaterally and she does have a history of a broken fifth metatarsal fracture to her left foot.   Objective: Physical Exam General: The patient is alert and oriented x3 in no acute distress.  Dermatology: Skin is warm, dry and supple bilateral lower extremities. Negative for open lesions or macerations.  Vascular: Palpable pedal pulses bilaterally. No edema or erythema noted. Capillary refill within normal limits.  Neurological: Epicritic and protective threshold grossly intact bilaterally.   Musculoskeletal Exam: Range of motion within normal limits to all pedal and ankle joints bilateral. Muscle strength 5/5 in all groups bilateral.   Radiographic Exam:  Jones fracture right fifth metatarsal nondisplaced closed.  Radiographic evidence of severe cavus foot deformity bilateral with increased calcaneal inclination angle and metatarsal declination angle.  Normal osseous mineralization. Joint spaces preserved. No fracture/dislocation/boney destruction.     Assessment: #1 Jones fracture right fifth metatarsal. #2 cavus foot deformity bilateral #3 pain in bilateral feet #4 swelling right foot Problem List Items Addressed This Visit    None    Visit Diagnoses    Foot pain, right    -  Primary   Relevant Orders   DG Foot Complete Right   Foot pain, left       Relevant Orders   DG Foot Complete Left   Jones fracture, right, closed, initial encounter       Cavus deformity of foot, acquired       bilateral feet         Plan of Care:  #1 Patient was evaluated. #2  Continue wearing cam boot and nonweightbearing for the next 4-6 weeks using the knee scooter #3 patient is to return to clinic in 3 weeks which will be 4 weeks after her initial injury #4 discussed with patient the conservative versus surgical management of cavus foot deformity. #5 once fracture healing has been achieved to the right fifth metatarsal we will discuss possible conservative treatment options for the cavus foot deformity to alleviate painful symptomology     Dr. Felecia ShellingBrent M. Mayari Matus, DPM [facility]

## 2016-05-23 NOTE — Patient Instructions (Signed)
Metatarsal Fracture With Rehab  A metatarsal fracture is a broken bone in one of the five bones that connect your toes to the rest of your foot (forefoot fracture). Metatarsals are long bones that can be stressed or cracked easily. A metatarsal fracture can be:  · A stress fracture. Stress fractures are cracks in the surface of the metatarsal bone. Athletes often get stress fractures.  · A complete fracture. A complete fracture goes all the way through the bone.  The bone that connects to the pinky toe (fifth metatarsal) is the most commonly fractured metatarsal. Ballet dancers often fracture this bone.  CAUSES   Stress fractures may be caused by:  · Poor training technique.  · Sudden increase in activity.  · Changing your activity to a harder surface.  · Wearing athletic shoes that do not have enough cushioning.  Complete fractures are usually caused by:  · Dropping a heavy object on your foot.  · An injury that severely twists your foot.  SIGNS AND SYMPTOMS  The most common symptom of a stress fracture is foot pain that goes away with rest. The most common symptom of a complete fracture is intense pain that persists after the injury. Other symptoms of both fracture types include:  · Bruising.  · Swelling.  · Pain with movement or putting weight on the foot.  · Tenderness or pain with pressure.  · Trouble walking.  DIAGNOSIS   Your health care provider may suspect a metatarsal fracture based on your symptoms and medical history. Your health care provider will also do a physical exam. During the physical exam, your health care provider may try to move your foot and toes to check for pain and limited movement. Your foot will also be checked for:  · Bruising.  · Tenderness.  · Swelling.  · Deformity.  Other tests that may be done include:  · X-rays. X-rays are able to show most fractures.  · A bone scan. This test may be necessary to show a stress fracture.  TREATMENT   Treatment for a metatarsal fracture depends on  how severe the fracture was and the type of fracture. Treatment may include:  · Surgery. Surgery is usually needed to repair a displaced fracture. A displaced fracture happens when pieces of the broken bone are moved out of place (displacement). After surgery, you may need to wear a short walking cast for 6 to 8 weeks.  · Medicines. Medicines may be used to reduce swelling and pain.  · Physical therapy. Physical therapy may last for several months.  · Use of a supportive device, such as:    Elastic wrap.    Splint.    Boot.    Cast.    Crutches to support weight on your foot until the broken bone heals.  You can usually treat a stress fracture or a nondisplaced fracture with:   · Rest.  · Ice.  · Elevation.  · Support.  HOME CARE INSTRUCTIONS  · Follow all your health care provider's instructions.  · Take medicine only as directed by your health care provider.  · Rest your foot until your health care provider says you can resume your usual activities.  · When resting, keep your foot raised above the level of your heart (elevated).  · Ice may help reduce pain and swelling.  ¨ Place ice in a plastic bag.  ¨ Place a towel between your skin and the bag.  ¨ Leave the ice on for 20   minutes, 2-3 times a day.  · Wear your supportive device as directed.  · Do not get your cast or splint wet.  · Keep all follow-up visits as directed by your health care provider. If your health care provider recommended physical therapy, it is very important for proper healing of your injury that you keep all visits.  PREVENTION   After your fracture has healed, you can prevent another fracture by:  · Starting new sports activities gradually.  · Cross training to avoid putting stress on the same part of your foot every day (such as alternate running with swimming or biking).  · Eat a healthy diet that includes plenty of calcium and vitamin D.  · Wear the right athletic shoes for your sport. Replace them when they wear out.  · Stop your  activity or training if you have pain or swelling in your foot. Rest for a few days.  SEEK MEDICAL CARE IF:  · You have pain that is getting worse.  · You develop chills or fever.  · Your foot feels numb.  · Your cast or splint is damaged.  · You notice swelling or redness below your cast or splint.  WHAT REHABILITATION EXERCISES CAN I DO AT HOME?  Ask your health care provider or physical therapist when you can start doing exercises at home. Doing these exercises 3-5 times a week can help you regain strength and flexibility. If doing any of these exercises causes pain, stop and contact your health care provider or physical therapist.  Heel Cord Stretch  Do 2 sets of 10 repetitions.  · Stand facing a wall with your healthy foot forward and your knee slightly bent.  · Place both hands on the wall for support.  · Stretch your healing foot out straight behind you.  · Keep both heels on the floor.  · Hold the stretch for 30 seconds.  · You can also do this exercise with both knees slightly bent. Repeat the same steps.  Golf Ball Roll  Do this once every day.  · Sit on a chair and roll a golf ball under your healing foot for two minutes.  Towel Stretch  · Sit on the floor with your legs out straight.  · Loop a towel around the front of your healing foot.  · Use both hands to pull the ends of the towel, stretching your foot back toward your body.  · Hold the stretch for 30 seconds and repeat 3 times.  Calf Raises  Do 2 sets of 10 repetitions.  · Stand behind a chair and use your hands to support you.  · Lift your good foot off the ground.  · Rise up on the front of your healing foot as high as you can.  · Repeat the lift 10 times.  Marble Pickup  Do this once a day.  · Sit in a chair and put 20 marbles on the floor in front of you.  · Use the toes of your healing foot to pick up each marble.  Towel Curls  Repeat this exercise 5 times.  · Sit in a chair and place a small towel on the floor in front of you.  · Use the toes  of your healing foot to grab the towel and pull it toward you.     This information is not intended to replace advice given to you by your health care provider. Make sure you discuss any questions you have with your health   care provider.     Document Released: 08/28/2005 Document Revised: 01/12/2015 Document Reviewed: 12/11/2013  Elsevier Interactive Patient Education ©2016 [employer]

## 2016-05-23 NOTE — Progress Notes (Signed)
   Subjective:    Patient ID: Cassandra Carter, female    DOB: 29-May-1981, 35 y.o.   MRN: [id number]  HPI    Review of Systems  Constitutional: Positive for fatigue and unexpected weight change.  Musculoskeletal: Positive for gait problem.  All other systems reviewed and are negative.      Objective:   Physical Exam        Assessment & Plan:

## 2016-05-26 ENCOUNTER — Encounter: Payer: BLUE CROSS/BLUE SHIELD | Attending: Psychiatry | Admitting: Dietician

## 2016-05-26 ENCOUNTER — Encounter: Payer: Self-pay | Admitting: Dietician

## 2016-05-26 VITALS — Ht 63.0 in | Wt 225.9 lb

## 2016-05-26 DIAGNOSIS — E669 Obesity, unspecified: Secondary | ICD-10-CM | POA: Diagnosis not present

## 2016-05-26 DIAGNOSIS — F509 Eating disorder, unspecified: Secondary | ICD-10-CM

## 2016-05-26 NOTE — Progress Notes (Signed)
Medical Nutrition Therapy Follow-up visit:  Time with patient: 8:05- 8:45 Visit #:9th this year ASSESSMENT:  Diagnosis:obesity, eating disorder  Current weight:225.9 (with boot)    Height: 63 in Medications: See list Medical History: hypertension, kidney disease, bipolar Progress and evaluation: Patient broke her right foot 1 week ago and is in a "boot". She states she was walking and heard it "pop". Weighed her (without her seeing weight) in boot. She reports she did not follow through with calling the [facility] and had planned to do so but will have to wait until foot heals. She reports that on most days she is eating 3 meals per day and 1-2 snacks. Her evening snack is Reese's cup and 20 oz. Coke and she states she doesn't drink soda or eat sweets at other times. She drinks 3 (16oz) bottles of water daily. She states that once weekly she skips lunch and then when she does eat by 3 or 4:00pm she then continues to eat something every hour or so. She states she avoids this when she eats lunch. Most of her evening meals are still being prepared at home. Her husband is cooking most of the meals and is trying to balance meat, starch and non-starchy vegetables. No salt is added in cooking or at the table. She usually takes leftovers for lunch.   Physical activity:none   NUTRITION CARE EDUCATION: Basic nutrition:  Commended patient on the awareness of how skipping lunch can trigger frequent eating for late afternoon and evening. She agreed that a consistent meal time would be a good goal for this visit. Again, discussed all the positive changes she has continued to maintain especially she and husband preparing evening meals which not only helps with more balanced meals but also lowers sodium and fat content.  Reviewed sodium recommendations.  INTERVENTION:  Avoid skipping lunch. Since peanut butter is the taste she desires for evening snack, consider alternating Reese's cups with peanut  butter on graham crackers.  EDUCATION MATERIALS GIVEN:  . Goals/ instructions  LEARNER/ who was taught:  . Patient  LEVEL OF UNDERSTANDING: . Verbalizes understanding LEARNING BARRIERS: . None  WILLINGNESS TO LEARN/READINESS FOR CHANGE:  Change in progress MONITORING AND EVALUATION:  Follow-up:06/30/16 at 8:15.

## 2016-06-13 ENCOUNTER — Ambulatory Visit (INDEPENDENT_AMBULATORY_CARE_PROVIDER_SITE_OTHER): Payer: BLUE CROSS/BLUE SHIELD | Admitting: Podiatry

## 2016-06-13 ENCOUNTER — Ambulatory Visit (INDEPENDENT_AMBULATORY_CARE_PROVIDER_SITE_OTHER): Payer: BLUE CROSS/BLUE SHIELD

## 2016-06-13 DIAGNOSIS — S99191G Other physeal fracture of right metatarsal, subsequent encounter for fracture with delayed healing: Secondary | ICD-10-CM | POA: Diagnosis not present

## 2016-06-13 DIAGNOSIS — R52 Pain, unspecified: Secondary | ICD-10-CM

## 2016-06-13 DIAGNOSIS — M216X9 Other acquired deformities of unspecified foot: Secondary | ICD-10-CM

## 2016-06-13 DIAGNOSIS — M79671 Pain in right foot: Secondary | ICD-10-CM

## 2016-06-14 ENCOUNTER — Telehealth: Payer: Self-pay | Admitting: *Deleted

## 2016-06-14 NOTE — Telephone Encounter (Signed)
No I don't need to see her. I'm okay giving her any documentation she needs to get her the short term disability.  - Dr. Logan BoresEvans

## 2016-06-14 NOTE — Telephone Encounter (Addendum)
Pt states Dr. Logan BoresEvans is looking at scheduling surgery, and she wanted to discuss getting short-term disability prior to the surgery because she can't keep up at work. 06/16/2016-Informed pt if she would take paperwork for disability to the [place] office it would be taken care of. Pt states she is having problems with both feet and Dr.Evans has x-rayed. I told her to make an appt and Dr. Logan BoresEvans would evaluate as to the current condition and she can take her paperwork as well. 07/05/2016-Pt [facility]. Informed pt Tramadol had to be called to the pharmacy not escribed, and [id number]

## 2016-06-16 ENCOUNTER — Ambulatory Visit: Payer: BLUE CROSS/BLUE SHIELD | Admitting: Podiatry

## 2016-06-18 NOTE — Progress Notes (Signed)
Subjective:  Patient presents today for follow-up evaluation of a fifth metatarsal fracture to the right foot. Patient does have severe cavus deformity bilaterally. Patient has a history of fifth metatarsal fractures secondary to cavus foot deformity and ambulating on the lateral columns of her bilateral feet. Patient presents today for further treatment and evaluation    Objective/Physical Exam General: The patient is alert and oriented x3 in no acute distress. Dermatology: Skin is warm, dry and supple bilateral lower extremities. Negative for open lesions or macerations. Vascular: Palpable pedal pulses bilaterally. No edema or erythema noted. Capillary refill within normal limits. Neurological: Epicritic and protective threshold grossly intact bilaterally.  Musculoskeletal Exam: Severe cavus deformity noted bilaterally with rear foot varus-flexible.   Radiographic Exam:  Normal osseous mineralization. Joint spaces preserved.  Fracture noted to the fifth metatarsal of the right foot with approximately 2 mm of displacement at the level of the metaphyseal diaphyseal junction. - unimproved since last visit Increased calcaneal inclination angle with metatarsal declination angle consistent with cavus foot deformity on lateral view.  Assessment: #1 Jones fracture fifth metatarsal right foot. #2 cavus deformity bilateral-flexible. #3 pain in bilateral feet.   Plan of Care:  #1 Patient was evaluated. #2 due to the nonhealing nature of the right fifth metatarsal fracture, patient will likely need surgery which will consist of ORIF right fifth metatarsal fracture. #3 discussed in detail the surgical management of cavus foot deformity. Patient consents for surgical correction at the same time as the ORIF of the fifth metatarsal fracture. At the moment surgery would consist of Steindler Stripping with possible calcaneal lateral closing wedge osteotomy of the right foot. #4 authorization for surgery  today. Patient is to be in contact with the surgery scheduler. #5 patient is to return to clinic 1 week prior to surgery to go over in detail for surgical intervention.  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 She states that her deductible is met this year.   Dr. Edrick Kins, [place]

## 2016-06-19 ENCOUNTER — Ambulatory Visit (INDEPENDENT_AMBULATORY_CARE_PROVIDER_SITE_OTHER): Payer: BLUE CROSS/BLUE SHIELD | Admitting: Podiatry

## 2016-06-19 DIAGNOSIS — S99191G Other physeal fracture of right metatarsal, subsequent encounter for fracture with delayed healing: Secondary | ICD-10-CM | POA: Diagnosis not present

## 2016-06-19 DIAGNOSIS — M79672 Pain in left foot: Secondary | ICD-10-CM | POA: Diagnosis not present

## 2016-06-19 DIAGNOSIS — M216X9 Other acquired deformities of unspecified foot: Secondary | ICD-10-CM

## 2016-06-19 DIAGNOSIS — M79671 Pain in right foot: Secondary | ICD-10-CM

## 2016-06-19 MED ORDER — OXYCODONE-ACETAMINOPHEN 5-325 MG PO TABS: 1.0000 | ORAL_TABLET | Freq: Three times a day (TID) | ORAL | 0 refills | Status: DC | PRN

## 2016-06-23 ENCOUNTER — Telehealth: Payer: Self-pay | Admitting: *Deleted

## 2016-06-23 NOTE — Telephone Encounter (Signed)
"  I have a patient's husband in front of me.  She [place]. [place] 9 [place].  She's supposed to be scheduling surgery with him.  Her husband is here wanting to know.  He stated nobody had called and he was concerned that there was an oversight.  That was just yesterday.  He is wanting to know if you can give them a call letting them know where they stand as far as getting surgery scheduled.  The best number to reach them is [phone].  Please call and let them know.

## 2016-06-26 ENCOUNTER — Telehealth: Payer: Self-pay | Admitting: Podiatry

## 2016-06-26 NOTE — Telephone Encounter (Signed)
Patient called this morning.  Surgery will be scheduled for July 13, 2016.

## 2016-06-26 NOTE — Telephone Encounter (Signed)
Pt called and is scheduled for surgery with Dr Logan BoresEvans and did not get the blue surgical bag. She would like her husband(Cassandra Carter(Chris) to pick it up 10.17.17.

## 2016-06-29 ENCOUNTER — Encounter: Payer: Self-pay | Admitting: Podiatry

## 2016-06-30 ENCOUNTER — Ambulatory Visit: Payer: BLUE CROSS/BLUE SHIELD | Admitting: Dietician

## 2016-07-02 NOTE — Progress Notes (Signed)
Subjective:  Patient presents today for follow-up evaluation of fifth metatarsal fracture to the right foot. Patient does have severe cavus deformity bilaterally. Patient has a history fifth metatarsal fracture secondary to cavus foot deformity and overloading of the lateral columns of the bilateral feet. Patient presents today for further treatment and evaluation    Objective/Physical Exam General: The patient is alert and oriented x3 in no acute distress.  Dermatology: Skin is warm, dry and supple bilateral lower extremities. Negative for open lesions or macerations.  Vascular: Palpable pedal pulses bilaterally. No edema or erythema noted. Capillary refill within normal limits.  Neurological: Epicritic and protective threshold grossly intact bilaterally.   Musculoskeletal Exam: Severe cavus deformity noted bilaterally with peripheral varus-flexible Range of motion within normal limits to all pedal and ankle joints bilateral. Muscle strength 5/5 in all groups bilateral.   Radiographic Exam:  There continues to be delayed healing of the fifth metatarsal fracture to the right foot- Jones fracture. Approximate 2 mm of displacement of the level of the metaphyseal diaphyseal junction. Unimproved. Increased calcaneal inclination angle with metatarsal declination angle consistent with cavus foot deformity on lateral views. Normal osseous mineralization.   Assessment: #1 Jones fracture fifth metatarsal right foot #2 symptomatic cavus deformity bilateral #3 pain in bilateral feet   Plan of Care:  #1 Patient was evaluated. #2 today the patient presents for surgical consult. Discussed in detail the options of conservative versus surgical management. Patient opts for surgical management. Surgery would consist of ORIF right fifth metatarsal fracture, Steindler Stripping, possible Dwyer osteotomy, all to the right lower extremity. #3 discussed in detail all possible complications and details of  surgery. Authorization for surgery initiated today. #4 patient is to return to clinic 1 week postop.    Dr. Felecia ShellingBrent M. Evans, DPM [facility]

## 2016-07-03 DIAGNOSIS — M79673 Pain in unspecified foot: Secondary | ICD-10-CM

## 2016-07-04 ENCOUNTER — Ambulatory Visit (INDEPENDENT_AMBULATORY_CARE_PROVIDER_SITE_OTHER): Payer: BLUE CROSS/BLUE SHIELD | Admitting: Podiatry

## 2016-07-04 DIAGNOSIS — M216X9 Other acquired deformities of unspecified foot: Secondary | ICD-10-CM

## 2016-07-04 DIAGNOSIS — S99191G Other physeal fracture of right metatarsal, subsequent encounter for fracture with delayed healing: Secondary | ICD-10-CM

## 2016-07-04 DIAGNOSIS — M79671 Pain in right foot: Secondary | ICD-10-CM | POA: Diagnosis not present

## 2016-07-04 DIAGNOSIS — M79672 Pain in left foot: Secondary | ICD-10-CM

## 2016-07-04 MED ORDER — TRAMADOL HCL 50 MG PO TABS: 50.0000 mg | ORAL_TABLET | Freq: Three times a day (TID) | ORAL | 0 refills | Status: AC | PRN

## 2016-07-12 NOTE — Progress Notes (Signed)
Subjective:  Patient presents today for follow-up evaluation of fifth metatarsal fracture to the right foot as well as cavus foot deformity bilaterally. Patient is scheduled for surgical intervention on 07/13/2016. Patient presents today for further details regarding surgery.   Objective/Physical Exam General: The patient is alert and oriented x3 in no acute distress.  Dermatology: Skin is warm, dry and supple bilateral lower extremities. Negative for open lesions or macerations.  Vascular: Palpable pedal pulses bilaterally. No edema or erythema noted. Capillary refill within normal limits.  Neurological: Epicritic and protective threshold grossly intact bilaterally.   Musculoskeletal Exam: Severe cavus deformity noted bilaterally with peripheral varus-flexible Range of motion within normal limits to all pedal and ankle joints bilateral. Muscle strength 5/5 in all groups bilateral.   Radiographic Exam:  There continues to be delayed healing of the fifth metatarsal fracture to the right foot- Jones fracture. Approximate 2 mm of displacement of the level of the metaphyseal diaphyseal junction. Unimproved. Increased calcaneal inclination angle with metatarsal declination angle consistent with cavus foot deformity on lateral views. Hindfoot varus deformity noted bilaterally. Normal osseous mineralization.   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 #2 discussed in detail all procedures to the right foot including Steindler stripping, Dwyer lateral closing wedge osteotomy, ORIF fifth metatarsal fracture, possible dorsiflexor wedge osteotomy first metatarsal. #3 discussed in detail all possible complications and details of surgery. Patient consents for surgical correction. #4 patient is to return to clinic 1 week postop.    Dr.  Felecia ShellingBrent M. Jaylea Plourde, DPM [facility]

## 2016-07-13 ENCOUNTER — Encounter: Payer: Self-pay | Admitting: Podiatry

## 2016-07-13 DIAGNOSIS — S92311K Displaced fracture of first metatarsal bone, right foot, subsequent encounter for fracture with nonunion: Secondary | ICD-10-CM | POA: Diagnosis not present

## 2016-07-13 DIAGNOSIS — Q6689 Other  specified congenital deformities of feet: Secondary | ICD-10-CM | POA: Diagnosis not present

## 2016-07-13 DIAGNOSIS — M216X1 Other acquired deformities of right foot: Secondary | ICD-10-CM | POA: Diagnosis not present

## 2016-07-14 ENCOUNTER — Ambulatory Visit (INDEPENDENT_AMBULATORY_CARE_PROVIDER_SITE_OTHER): Payer: BLUE CROSS/BLUE SHIELD | Admitting: Podiatry

## 2016-07-14 ENCOUNTER — Encounter: Payer: Self-pay | Admitting: Podiatry

## 2016-07-14 ENCOUNTER — Telehealth: Payer: Self-pay | Admitting: Podiatry

## 2016-07-14 DIAGNOSIS — Z9889 Other specified postprocedural states: Secondary | ICD-10-CM

## 2016-07-14 DIAGNOSIS — M216X9 Other acquired deformities of unspecified foot: Secondary | ICD-10-CM

## 2016-07-14 DIAGNOSIS — S99191G Other physeal fracture of right metatarsal, subsequent encounter for fracture with delayed healing: Secondary | ICD-10-CM

## 2016-07-14 MED ORDER — HYDROMORPHONE HCL 4 MG PO TABS: 4.0000 mg | ORAL_TABLET | ORAL | 0 refills | Status: DC | PRN

## 2016-07-14 NOTE — Telephone Encounter (Signed)
Pts husband called and pt had surgery yesterday with Dr Logan BoresEvans and is having an issue with the boot. Pt thinks she has wiggled her way out of the boot and wants to make sure they are putting it on correctly. They are going to come to the [place] office to have it checked. I did not put her on the schedule but they are on there way now

## 2016-07-14 NOTE — Progress Notes (Signed)
Patient presents to the office today 1 day post op ORIF, TENDO-ACHILLES LENGTH AND STEINDLER STRIPPING RIGHT stating that she is having severe heel pain and her heel doesn't touch the bottom of the boot. I explained to her that her heel will not touch the bottom of the boot due to non-weight bearing and the inability to dorsiflex at her ankle. She says her pain meds are not helping the pain and when she take 2 pills at a time it makes her nauseated. Her bandage around her heel is soaked through with fresh blood.   I informed Dr. Logan BoresEvans of patient's concerns. He changed her pain meds to Dilaudid and I also reinforced her dressing today and advised her to remain stationary, elevate her foot and rest as much as possible. I also informed her she may be experiencing these sharp needle like sensations due to the nerve block wearing off.   She will keep scheduled appointment for next Friday and let us know if she has any other concerns.

## 2016-07-15 ENCOUNTER — Telehealth: Payer: Self-pay | Admitting: Sports Medicine

## 2016-07-15 NOTE — Telephone Encounter (Signed)
Patient called stating that within the last hour it feel like there's a rubber-band around her surgical foot. I advised her to adjust the ace wrap and to cut a small slit in the dressing to prevent it from being too tight. The patient did so while on the phone with me and felt immediate relief. I again encouraged patient to continue with ice and elevation and pain medications as needed. Advised patient to call back if there are any other issues. -Dr. Marylene LandStover

## 2016-07-15 NOTE — Telephone Encounter (Signed)
Patient's husband called stating that there is blood on inner layers of the dressing. Reports that on yesterday went to [place] office for dressing and the blood was bright red and a lot however today the redness of the blood is a lighter color, feels hard, and a smaller amount; he was calling just to make sure that this was ok. I advised patient's husband that some drainage is to be expected on the inner dressing layers and will feel hard as the blood has dried. Husband confirmed that there was no strike-through present. I advised to continue with ice and elevation without the boot since the incision is at heel and hurts when foot is elevated with boot on. Encouraged to replace boot when getting up. I advised if there is bloody strike-through on the ACE to reinforce with another ACE wrap and call me back for further instruction. Husband expressed understanding and states that he will help wife continue to monitor the dressing and call back if there is an issue.

## 2016-07-16 NOTE — Telephone Encounter (Signed)
Patient and husband called again with questions regarding surgical dressing, stating that they wanted to make sure that she didn't need to go to urgent care or come to office today for a dressing change since the slit was cut in the dressing on yesterday. I advised patient that the dressing did not need to be changed today unless there is bleeding striking through or the bandage has come off. I advised patient that if bandage feels loose may adjust the ace wrap. I encouraged patient and husband DO NOT remove or get bandages wet to reduce chances of post op infection. Patient and husband expressed understanding and I advised them if they are still concerned about the dressing on Monday that they could call office to come in for another dressing change otherwise if no acute concerns or changes to surgical foot to keep appointment as scheduled for Friday. Patient understood and denied any other problems or symptoms. -Dr. Marylene LandStover

## 2016-07-17 ENCOUNTER — Ambulatory Visit (INDEPENDENT_AMBULATORY_CARE_PROVIDER_SITE_OTHER): Payer: BLUE CROSS/BLUE SHIELD | Admitting: Podiatry

## 2016-07-17 ENCOUNTER — Encounter: Payer: Self-pay | Admitting: Sports Medicine

## 2016-07-17 DIAGNOSIS — Z9889 Other specified postprocedural states: Secondary | ICD-10-CM

## 2016-07-17 NOTE — Postoperative (Signed)
Patient called stating that her leg is feeling numb. States that her leg all of a sudden is feeling worse. With patient and husband on phone I had husband check capillary fill time which was normal. I advised patient that more of the nerves are probably starting to wake up as the nerve block is wearing off and that she could be experiencing more numbness from the position that she has also been sleeping in all night. I advise another pi llow behind the knee and icing. I also asked patient to wiggle her toes which she was able to do and to loosen the ACE wrap again. I advised husband to give wife another tablet of Hydromorphone  at 5am and if no improvement afterwards to give me a call back. Patient and husband expressed understanding.  -Dr. Marylene LandStover

## 2016-07-17 NOTE — Telephone Encounter (Signed)
Phone notes from this AM as charted in [employer] Encounter.  Add to notes, I had husband exam patient's calf who confirmed no swelling, no redness, no shortness or breath. No acute signs of DVT. -Dr. Marylene LandStover

## 2016-07-17 NOTE — Telephone Encounter (Signed)
Patient's husband called again stating that they tried icing, more pillows, and another Dilaudid at 5am with no change in symptoms of numbness and pain to surgical leg. I advised to try an additional dose of Tylenol instead of Motrin since patient had last dose about 1 hour ago. Advised if no relief to go to ER for further evaluation. I advised patient's husband that office opens at 8am and if symptoms are not improved to go to ER since office is closed at this time. Patient's husband expressed understanding and states he will try this. -Dr. Marylene LandStover

## 2016-07-19 ENCOUNTER — Telehealth: Payer: Self-pay | Admitting: *Deleted

## 2016-07-19 ENCOUNTER — Telehealth: Payer: Self-pay | Admitting: Podiatry

## 2016-07-19 DIAGNOSIS — M79673 Pain in unspecified foot: Secondary | ICD-10-CM

## 2016-07-19 NOTE — Telephone Encounter (Signed)
Spouse came to office today at 4:47 pm. Looking for pain RX for wife. We don't have the rx and no one has sent a [facility] or Dr. Al CorpusHyatt to prepare this rx. Please advise.

## 2016-07-19 NOTE — Telephone Encounter (Signed)
Yes! If she needs pain rx I'm okay with Dilaudid 4mg  po q8h #30

## 2016-07-19 NOTE — Telephone Encounter (Addendum)
Pt's husband asked for a refill of pt's hydromorphone. Informed pt's husband, Jonny RuizJohn he could pick up the rx in the [place] office tomorrow. John states understanding. 07/21/2016-Pt's husband, Jonny RuizJohn asked why pt 's medication was to be taken every 6 hours rather than 4 as before. Informed John, Dr. Logan BoresEvans was increasing the time between the Dilaudid to eventually get pt off of pain medication post op, this was done for all post op pt. I told Jonny RuizJohn if pt was able to tolerate Ibuprofen then she could take that as the package directs in between the dosing of the dilaudid. John states understanding.

## 2016-07-19 NOTE — Telephone Encounter (Signed)
Patients spouse Jonny RuizJohn came into the office looking for a RX for refill on pain meds for his wife. He said that he called [place] office this morning and asked for [facility] to be [facility].  Hydro- morphone.  Needs paper script sent here for pick up. Only has 1 day of med left.

## 2016-07-20 ENCOUNTER — Other Ambulatory Visit: Payer: Self-pay | Admitting: Podiatry

## 2016-07-20 MED ORDER — HYDROMORPHONE HCL 4 MG PO TABS: 4.0000 mg | ORAL_TABLET | Freq: Four times a day (QID) | ORAL | 0 refills | Status: DC | PRN

## 2016-07-20 NOTE — Progress Notes (Signed)
Patients husband presented today to pick up rx for dilaudid. Refilled.

## 2016-07-21 ENCOUNTER — Encounter: Payer: Self-pay | Admitting: Podiatry

## 2016-07-21 NOTE — Telephone Encounter (Signed)
Okay, thanks

## 2016-07-22 NOTE — Progress Notes (Signed)
Subjective: Patient presents today status post 4 days reconstructive surgery to the right foot. Date of surgery 07/13/2016 lower extremity in the cam boot.  Objective: Incision site is well coapted. Staples and sutures are intact. No sign of infection.  Assessment: Status post reconstructive surgery cavus foot correction right lower extremity. Date of surgery 07/13/2016.  Plan of care: Today dressing changes were performed patient is doing well. Explained to the patient that numbness, tingling, dropping, and all sensations to the lower extremity are normal. Return to clinic on scheduled appointment

## 2016-07-24 ENCOUNTER — Telehealth: Payer: Self-pay | Admitting: Podiatry

## 2016-07-24 NOTE — Telephone Encounter (Signed)
I don't recall any paperwork I received regarding FMLA. I didn't receive is personally anyway. Perhaps it was given to [facility].  Dr. Logan BoresEvans

## 2016-07-24 NOTE — Telephone Encounter (Signed)
Pts husband called checking on FMLA paperwork he said he gave to Dr Logan BoresEvans last week in [place] office  for himself to help wife since surgery.

## 2016-07-25 ENCOUNTER — Ambulatory Visit (INDEPENDENT_AMBULATORY_CARE_PROVIDER_SITE_OTHER): Payer: BLUE CROSS/BLUE SHIELD | Admitting: Podiatry

## 2016-07-25 DIAGNOSIS — Z9889 Other specified postprocedural states: Secondary | ICD-10-CM

## 2016-07-28 ENCOUNTER — Encounter: Payer: Self-pay | Admitting: Dietician

## 2016-07-28 ENCOUNTER — Encounter: Payer: Self-pay | Admitting: Podiatry

## 2016-07-28 ENCOUNTER — Encounter: Payer: BLUE CROSS/BLUE SHIELD | Attending: Psychiatry | Admitting: Dietician

## 2016-07-28 DIAGNOSIS — E6609 Other obesity due to excess calories: Secondary | ICD-10-CM

## 2016-07-28 DIAGNOSIS — Z6839 Body mass index (BMI) 39.0-39.9, adult: Secondary | ICD-10-CM

## 2016-07-28 DIAGNOSIS — E669 Obesity, unspecified: Secondary | ICD-10-CM | POA: Insufficient documentation

## 2016-07-28 DIAGNOSIS — F509 Eating disorder, unspecified: Secondary | ICD-10-CM

## 2016-07-28 NOTE — Progress Notes (Signed)
Medical Nutrition Therapy Follow-up visit:  Time with patient: 8:05-8:30am Visit #: 10th this year ASSESSMENT:  Diagnosis:obesity, eating disorder  Current weight:could not weigh today    Height:63 in Medications: See list Medical History: hypertension, kidney disease, bipolar Progress and evaluation: Patient in for medical nutrition therapy follow-up. She had surgery on her right foot on 07/13/16 (screws inserted) due to fracture. She is using a scooter and cannot put any weight on her foot.She is continuing to take pain medication which makes her drowsy.  She reports that her appetite decreased after surgery but she is beginning to eat on a more consistent basis.  She is eating 3 meals per day. She states her husband had been picking up more "fast foods" but this week resumed preparing more meals at home. She states that at least  once weekly she will eat several snacks in the evening and feels somewhat out of control but denies any binging.  NUTRITION CARE EDUCATION: Basic nutrition:  Discussed role of nutrition in healing process.  Encouraged for she and husband to add foods needed to meet basic nutrient needs such as fruits, vegetables and calcium sources. Stressed need for adequate fluid.  INTERVENTION: Use arm bands to exercise arms. Try to stay on a meal schedule of 3 meals spaced 4-5 hours apart. Include fruits and vegetables with as many meals as possible for nutrients but also to increase fiber. Include at least 64 oz.of fluid daily with at least half being water. Limit soda.  EDUCATION MATERIALS GIVEN:  . Goals/ instructions LEARNER/ who was taught:  . Patient   LEVEL OF UNDERSTANDING: . Verbalizes understanding LEARNING BARRIERS: . None  MONITORING AND EVALUATION: follow-up 08/25/16 at 8:15am.

## 2016-07-28 NOTE — Patient Instructions (Signed)
Use arm bands to exercise arms. Try to stay on a meal schedule of 3 meals spaced 4-5 hours apart. Include fruits and vegetables with as many meals as possible for nutrients but also to increase fiber. Include at least 64 oz.of fluid daily with at least half being water. Limit soda.

## 2016-08-01 ENCOUNTER — Ambulatory Visit (INDEPENDENT_AMBULATORY_CARE_PROVIDER_SITE_OTHER): Payer: BLUE CROSS/BLUE SHIELD | Admitting: Podiatry

## 2016-08-01 DIAGNOSIS — Z9889 Other specified postprocedural states: Secondary | ICD-10-CM

## 2016-08-01 NOTE — Progress Notes (Signed)
Subjective: Patient presents today status post reconstructive surgery of the right foot. Date of surgery 07/13/2016 lower extremity in a cam boot. Next  Objective: Incision sites are well coapted. Sutures and staples are intact. There is no sign of infection. There is a moderate amount of diffuse edema noted to the right foot and ankle. Next  Assessment: Status post reconstructive surgery cavus foot correction right lower extremity. Date of surgery 07/13/2016. Next  Plan of care: Today sutures and staples were removed. A short cam boot was dispensed today. The patient the numbness tingling and all sensations to the lower extremity are normal at this moment. Patient is to begin showering in getting the foot wet beginning on Monday, 08/07/2016. Return to clinic in 4 weeks.

## 2016-08-06 NOTE — Progress Notes (Signed)
Subjective: Patient presents today status post reconstructive surgery to the right foot. Date of surgery 07/13/2016 lower extremity in the cam boot.  Objective: Incision site is well coapted. Staples and sutures are intact. No sign of infection. There is moderate edema noted to the right foot. He'll blister aeration also noted to the posterior aspect of the right foot with minimal purulent drainage. No sign of active infection, more likely nonviable cellular debris. No malodor. Periwound is mildly macerated.  Assessment: Status post reconstructive surgery cavus foot correction right lower extremity. Date of surgery 07/13/2016.  Plan of care: Today dressing changes were performed patient is doing well. Explained to the patient that numbness, tingling, dropping, and all sensations to the lower extremity are normal. Return to clinic in 1 week

## 2016-08-25 ENCOUNTER — Ambulatory Visit: Payer: BLUE CROSS/BLUE SHIELD | Admitting: Dietician

## 2016-08-25 ENCOUNTER — Encounter: Payer: Self-pay | Admitting: Podiatry

## 2016-08-28 NOTE — Progress Notes (Signed)
DOS 11.02.2017 Open Reduction Internal Fixation Fifth Metatarsal Fracture Right Foot. Steindler Stripping Right Foot. Possible Dwyer Osteotomy Right Foot.

## 2016-08-29 ENCOUNTER — Telehealth: Payer: Self-pay | Admitting: *Deleted

## 2016-08-29 ENCOUNTER — Ambulatory Visit (INDEPENDENT_AMBULATORY_CARE_PROVIDER_SITE_OTHER): Payer: BLUE CROSS/BLUE SHIELD | Admitting: Podiatry

## 2016-08-29 ENCOUNTER — Ambulatory Visit (INDEPENDENT_AMBULATORY_CARE_PROVIDER_SITE_OTHER): Payer: BLUE CROSS/BLUE SHIELD

## 2016-08-29 DIAGNOSIS — M216X9 Other acquired deformities of unspecified foot: Secondary | ICD-10-CM | POA: Diagnosis not present

## 2016-08-29 DIAGNOSIS — S99191G Other physeal fracture of right metatarsal, subsequent encounter for fracture with delayed healing: Secondary | ICD-10-CM

## 2016-08-29 DIAGNOSIS — M722 Plantar fascial fibromatosis: Secondary | ICD-10-CM

## 2016-08-29 DIAGNOSIS — M79672 Pain in left foot: Secondary | ICD-10-CM | POA: Diagnosis not present

## 2016-08-29 NOTE — Telephone Encounter (Signed)
"  I was just told to call you to make an appointment.  Give me a call."

## 2016-08-30 NOTE — Telephone Encounter (Signed)
"  I was told to call you to schedule surgery.  I would like to have it done by the end of the month."  Dr. Logan BoresEvans had a cancellation so he can do it on December 28.  Someone from the surgical center will call you with the arrival time.  You can register now, instructions are in the surgical center brochure.

## 2016-09-07 ENCOUNTER — Encounter: Payer: Self-pay | Admitting: Podiatry

## 2016-09-07 ENCOUNTER — Ambulatory Visit (INDEPENDENT_AMBULATORY_CARE_PROVIDER_SITE_OTHER): Payer: BLUE CROSS/BLUE SHIELD | Admitting: Podiatry

## 2016-09-07 ENCOUNTER — Telehealth: Payer: Self-pay | Admitting: *Deleted

## 2016-09-07 DIAGNOSIS — M722 Plantar fascial fibromatosis: Secondary | ICD-10-CM

## 2016-09-07 DIAGNOSIS — Z9889 Other specified postprocedural states: Secondary | ICD-10-CM

## 2016-09-07 DIAGNOSIS — M24575 Contracture, left foot: Secondary | ICD-10-CM | POA: Diagnosis not present

## 2016-09-07 DIAGNOSIS — R600 Localized edema: Secondary | ICD-10-CM

## 2016-09-07 DIAGNOSIS — M25775 Osteophyte, left foot: Secondary | ICD-10-CM | POA: Diagnosis not present

## 2016-09-07 NOTE — Progress Notes (Signed)
Subjective:  Patient presents today status post reconstructive surgery of the right foot. Date of surgery 07/13/2016. Patient still complains of numbness. Patient states that she is otherwise improving and feeling well. Patient also presents today for surgical consult and evaluation of reconstructive surgery to the left lower extremity. Patient states that she is ready to undergo surgical correction and reconstructive surgery to the left lower extremity for symptomatic cavus foot deformity. Patient has ongoing pain in her foot during ambulation which is persistent for several years. All conservative modalities have been unsuccessful in providing any sort of satisfactory alleviation of symptoms for the patient. Patient presents today for further treatment and evaluation    Objective/Physical Exam General: The patient is alert and oriented x3 in no acute distress.  Dermatology: Skin is warm, dry and supple bilateral lower extremities. Negative for open lesions or macerations.  Vascular: Palpable pedal pulses bilaterally. No edema or erythema noted. Capillary refill within normal limits.  Neurological: Epicritic and protective threshold grossly intact bilaterally.   Musculoskeletal Exam: Range of motion within normal limits to all pedal and ankle joints bilateral. Muscle strength 5/5 in all groups bilateral.  Moderate edema noted to the right lower extremity secondary to surgery. All incision sites are well coapted. 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 Increased medial longitudinal arch noted. Calcaneal varus noted during mid stance. Coleman block test indicates flexible deformity. Cavus deformity type appears to be global without evidence of a consistent apex of the deformity.  Radiographic Exam:  Right foot: All orthopedic hardware is intact. All osteotomies are healing routinely. There does appear to be a delayed union of the fifth metatarsal Jones fracture to the  right lower extremity. However, orthopedic screw is intact.  Left foot: Cavovarus deformity noted to the left lower extremity.  Rare foot varus noted during mid stance. Calcaneal axial views indicate calcaneal varus deformity. Increased metatarsal declination angle noted to the first ray. Increased calcaneal inclination angle noted.  Assessment: #1 status post reconstructive surgery right foot. Healing as expected. 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 #2 today a postoperative shoe was dispensed for the right lower extremity. Patient may begin partial weightbearing on the right lower extremity. #3 today we discussed in detail surgical reconstruction of the left foot. Surgical reconstruction will consist of wire calcaneal osteotomy, Steindler stripping, dorsiflexor wedge osteotomy metatarsal. All details of the procedure were explained. All possible complications explained. All patient questions were answered. No guarantees were expressed or implied. #4 authorization for surgery initiated today #5 patient is to return to clinic 1 week postop   Felecia ShellingBrent M. Jakwon Gayton, DPM [facility]  Dr. Felecia ShellingBrent M. Tinsley Lomas, DPM   [address]                                        [place], [place] [id number]                Office [phone]  Fax [phone]

## 2016-09-07 NOTE — Telephone Encounter (Signed)
Pt's husband, Jonny RuizJohn states pt had surgery a few hours ago and the dressing is soaked with blood. I informed Jonny RuizJohn it was probably due to [place] with her foot below her heart and would stop once she had gotten off of it and elevated it. I told him that if he would take her to the [place] office, either Dr. Logan BoresEvans or his assistant would reinforce the dressing. John states understanding.

## 2016-09-09 NOTE — Progress Notes (Signed)
Subjective: Patient presents today status post reconstructive surgery to the left foot. Patient had surgery this a.m. and the patient states that when she got home she noticed blood on the bandage. Patient presents today for further evaluation  Objective: Sanguinous drainage noted to the dressings. Appear stable without active bleeding.  Assessment: Status post reconstructive surgery left foot. Status post reconstructive surgery right foot. Date of surgery for right foot 07/13/2016. Edema right foot.  Plan of care: Today patient was evaluated and dressings were changed. Cam boot was dispensed for patient's left lower extremity. Compression anklet was applied to the patient's right lower extremity for right lower extremity edema return to clinic in 1 week

## 2016-09-14 ENCOUNTER — Ambulatory Visit (INDEPENDENT_AMBULATORY_CARE_PROVIDER_SITE_OTHER): Payer: BLUE CROSS/BLUE SHIELD | Admitting: Podiatry

## 2016-09-14 ENCOUNTER — Encounter: Payer: Self-pay | Admitting: Podiatry

## 2016-09-14 ENCOUNTER — Ambulatory Visit (INDEPENDENT_AMBULATORY_CARE_PROVIDER_SITE_OTHER): Payer: BLUE CROSS/BLUE SHIELD

## 2016-09-14 DIAGNOSIS — Z9889 Other specified postprocedural states: Secondary | ICD-10-CM

## 2016-09-14 DIAGNOSIS — R600 Localized edema: Secondary | ICD-10-CM | POA: Diagnosis not present

## 2016-09-14 MED ORDER — OXYCODONE-ACETAMINOPHEN 5-325 MG PO TABS: 1.0000 | ORAL_TABLET | ORAL | 0 refills | Status: DC | PRN

## 2016-09-17 NOTE — Progress Notes (Signed)
Subjective: Patient presents today status post reconstructive surgery to the left foot. Status post reconstructive surgery of the right foot. Date of surgery for right foot 07/13/2016. Date of surgery for left foot 09/07/2016.  Patient states that she's doing well.  Objective: Skin incisions are well coapted. Sutures are intact. Moderate edema noted to the left lower extremity. Edema noted to the right lower extremity as well.  Assessment: #1 status post reconstructive surgery right foot. 07/13/2016. #2 status post reconstructive surgery left foot. 09/07/2016.  Plan of care: Today dressings were changed. Compression anklet dispensed for right lower extremity. Return to clinic in 1 week.

## 2016-09-22 ENCOUNTER — Ambulatory Visit (INDEPENDENT_AMBULATORY_CARE_PROVIDER_SITE_OTHER): Payer: BLUE CROSS/BLUE SHIELD | Admitting: Podiatry

## 2016-09-22 DIAGNOSIS — Z9889 Other specified postprocedural states: Secondary | ICD-10-CM

## 2016-09-27 NOTE — Progress Notes (Signed)
Subjective: Patient presents today status post reconstructive surgery to the left foot. Status post reconstructive surgery of the right foot. Date of surgery for right foot 07/13/2016. Date of surgery for left foot 09/07/2016.  Patient states that she's doing well.  Objective: Skin incisions are well coapted. Sutures are intact. Moderate edema noted to the left lower extremity. Edema noted to the right lower extremity as well.  Assessment: #1 status post reconstructive surgery right foot. 07/13/2016. #2 status post reconstructive surgery left foot. 09/07/2016.  Plan of care: Today dressings were changed. Sutures were removed. Patient can begin showering. Return to clinic in 4 weeks

## 2016-09-29 ENCOUNTER — Telehealth: Payer: Self-pay | Admitting: Podiatry

## 2016-09-29 ENCOUNTER — Other Ambulatory Visit: Payer: Self-pay

## 2016-09-29 ENCOUNTER — Encounter: Payer: BLUE CROSS/BLUE SHIELD | Admitting: Podiatry

## 2016-09-29 MED ORDER — OXYCODONE-ACETAMINOPHEN 5-325 MG PO TABS: 1.0000 | ORAL_TABLET | ORAL | 0 refills | Status: DC | PRN

## 2016-09-29 NOTE — Telephone Encounter (Signed)
Note sent to Dr. Logan BoresEvans, A. Glynda JaegerVenable and J. Ward that pt request refill of Oxycodone.

## 2016-09-29 NOTE — Telephone Encounter (Signed)
Patient called stating she needs a refill on her Oxycodone.

## 2016-10-09 NOTE — Progress Notes (Signed)
DOS 12.28.2017 Dwyer closing wedge calcaneal osteotomy left. Dorsiflexory wedge osteotomy first metatarsal left. Steindler stripping left. Any other indicated procedure.

## 2016-10-20 ENCOUNTER — Ambulatory Visit (INDEPENDENT_AMBULATORY_CARE_PROVIDER_SITE_OTHER): Payer: Self-pay | Admitting: Podiatry

## 2016-10-20 ENCOUNTER — Ambulatory Visit (INDEPENDENT_AMBULATORY_CARE_PROVIDER_SITE_OTHER): Payer: BLUE CROSS/BLUE SHIELD

## 2016-10-20 DIAGNOSIS — Z9889 Other specified postprocedural states: Secondary | ICD-10-CM

## 2016-10-20 MED ORDER — OXYCODONE-ACETAMINOPHEN 5-325 MG PO TABS: 1.0000 | ORAL_TABLET | ORAL | 0 refills | Status: DC | PRN

## 2016-10-24 ENCOUNTER — Telehealth: Payer: Self-pay | Admitting: *Deleted

## 2016-10-24 DIAGNOSIS — Z9889 Other specified postprocedural states: Secondary | ICD-10-CM

## 2016-10-24 NOTE — Telephone Encounter (Addendum)
Pt states Dr. Logan BoresEvans told her he would order PT for her. I informed pt that I would message Dr. Logan BoresEvans for orders and that in [place] our office used [facility] and I would give her the phone number once I had received the orders. 10/26/2016-Pt called for PT referral. I informed pt that I was waiting for Dr. Logan BoresEvans to review her status and give orders. I told her I would call with more information once he gave me orders. 10/27/2016-I informed pt Dr. Logan BoresEvans had ordered PT with [facility] [phone]. Faxed orders and required referral, and demographics to [facility].12/04/2016-Pt's husband, Jonny RuizJohn states pt is having more pain after PT and would like pain medication. I spoke with pt and she stated at PT she did a stretch and the left foot where he did the surgery hurt so bad she was not able to walk ost of the weekend, and doesn't think she can continue PT. I told pt to go back in to the surgical boot and I would inform Dr. Logan BoresEvans and call again. Dr.Evans states have pt stay in the cam walker postpone further PT until evaluated on 12/12/2016, and refill the Oxycodone as 10/20/2016. Orders called to pt and she is to pick up the rx in the [place] office.

## 2016-10-27 NOTE — Telephone Encounter (Signed)
Per Dr Logan BoresEvans dictation is in yes order PT bilateral post reconstructive cavus foot Gait training, strength training and range of motion.

## 2016-10-27 NOTE — Progress Notes (Signed)
Subjective: Patient presents today status post reconstructive surgery to the left foot. Status post reconstructive surgery of the right foot. Date of surgery for right foot 07/13/2016. Date of surgery for left foot 09/07/2016.  Patient states that she's doing well. Patient continues to experience pain and swelling which radiates up the leg to the left lower extremity. Patient also has moderate swelling to the bilateral lower extremities.  Patient also has difficulty ambulating. She states that she is not walking normally for a long period of time.   Objective: Skin incisions are well coapted. Complete reepithelialization has occurred. Moderate edema noted to the left lower extremity. Edema noted to the right lower extremity as well. Radiographic exam bilateral lower extremities appears the osteotomy sites are stable. Orthopedic hardware is intact. Patient also has decreased muscle strength to the bilateral lower extremities.  Assessment: #1 status post reconstructive surgery right foot. 07/13/2016. #2 status post reconstructive surgery left foot. 09/07/2016.  Plan of care: #1 patient was evaluated #2 today were in order physical therapy for the patient. Increase range of motion and gait training to the bilateral lower extremities as well as decrease edema and swelling. #3 prescription for Percocet 5/325mg  #4 return to clinic in 3 months

## 2016-11-03 ENCOUNTER — Telehealth: Payer: Self-pay | Admitting: *Deleted

## 2016-11-03 NOTE — Telephone Encounter (Signed)
Patient came by and wanted to see if she could get a x-small darco shoe, the other shoe was too big and per Dr Logan Bores stated to go ahead and get the patient one and Burnett Harry is going to put the charge in. Misty Stanley

## 2016-11-07 ENCOUNTER — Encounter: Payer: Self-pay | Admitting: Podiatry

## 2016-11-07 ENCOUNTER — Ambulatory Visit (INDEPENDENT_AMBULATORY_CARE_PROVIDER_SITE_OTHER): Payer: BLUE CROSS/BLUE SHIELD | Admitting: Podiatry

## 2016-11-07 ENCOUNTER — Ambulatory Visit (INDEPENDENT_AMBULATORY_CARE_PROVIDER_SITE_OTHER): Payer: BLUE CROSS/BLUE SHIELD

## 2016-11-07 DIAGNOSIS — Z9889 Other specified postprocedural states: Secondary | ICD-10-CM

## 2016-11-07 DIAGNOSIS — M216X9 Other acquired deformities of unspecified foot: Secondary | ICD-10-CM | POA: Diagnosis not present

## 2016-11-07 DIAGNOSIS — S99191G Other physeal fracture of right metatarsal, subsequent encounter for fracture with delayed healing: Secondary | ICD-10-CM

## 2016-11-10 ENCOUNTER — Ambulatory Visit (INDEPENDENT_AMBULATORY_CARE_PROVIDER_SITE_OTHER): Payer: BLUE CROSS/BLUE SHIELD | Admitting: Certified Nurse Midwife

## 2016-11-10 ENCOUNTER — Encounter: Payer: Self-pay | Admitting: Certified Nurse Midwife

## 2016-11-10 VITALS — BP 124/85 | HR 108 | Ht 63.0 in | Wt 235.4 lb

## 2016-11-10 DIAGNOSIS — N926 Irregular menstruation, unspecified: Secondary | ICD-10-CM | POA: Diagnosis not present

## 2016-11-10 DIAGNOSIS — N912 Amenorrhea, unspecified: Secondary | ICD-10-CM

## 2016-11-10 LAB — POCT URINE PREGNANCY: Preg Test, Ur: NEGATIVE

## 2016-11-10 NOTE — Progress Notes (Signed)
Pt is here with c/o very irregular periods. LMP 07/13/16. LPS 2016 and was normal. 08/2014-01/2015 had very heavy periods. Was given short term OCPs and they helped but she had to stop taking due to high BP.

## 2016-11-10 NOTE — Patient Instructions (Signed)

## 2016-11-10 NOTE — Progress Notes (Signed)
GYN ENCOUNTER NOTE  Subjective:       Mikey CollegeVictoria Ann Maulden is a 36 y.o. 213P1010 female is here for gynecologic evaluation of the following issues: amenorrhea.   Her last menstrual period was 07/13/2016, this period was lighter and shorter than her usual cycle lasing only three (3) or four (4) days. This cycle was accompanied by some left sided cramping that resolved after a few days.   She reports a history of heavy painful periods that were controlled with OCPs six (6) months, but medication was discontinued due to her HTN.   Denies difficulty breathing or respiratory distress, chest pain, abdominal pain, dysuria, and leg pain or swell.   History significant for Bipolar disorder, HTN, and recent bilateral foot surgery (07/2016).   [place]ia has gained 10 pounds since September, but attributes it to the foot surgery. She also reports darkening of the hair on her face.    Gynecologic History  Patient's last menstrual period was 07/13/2016 (exact date).   Contraception: condoms   Last Pap: 2016. Results were: normal  Obstetric History OB History  Gravida Para Term Preterm AB Living  3 1 1   1     SAB TAB Ectopic Multiple Live Births  1            # Outcome Date GA Lbr Len/2nd Weight Sex Delivery Anes PTL Lv  3 Gravida           2 SAB           1 Term               Past Medical History:  Diagnosis Date  . Anxiety   . Bipolar disorder (HCC)   . Depression   . Headache   . History of miscarriage June 2011   1st trimester  . Hypertension   . Shortness of breath dyspnea   . Vesicoureteral reflux with resulting kidney disease, unilateral     Past Surgical History:  Procedure Laterality Date  . ESOPHAGOGASTRODUODENOSCOPY (EGD) WITH PROPOFOL N/A 06/28/2015   Procedure: ESOPHAGOGASTRODUODENOSCOPY (EGD) WITH PROPOFOL;  Surgeon: Elnita MaxwellMatthew Gordon Rein, MD;  [facility];  Service: Endoscopy;  Laterality: N/A;    Current Outpatient Prescriptions on File Prior to Visit   Medication Sig Dispense Refill  . Cyanocobalamin (RA VITAMIN B-12 TR) 1000 MCG TBCR Take by mouth.    . DULoxetine (CYMBALTA) 60 MG capsule     . LamoTRIgine 50 MG TB24 Take by mouth.    . nabumetone (RELAFEN) 750 MG tablet Take 750 mg by mouth 2 (two) times daily.    Marland Kitchen. oxyCODONE-acetaminophen (PERCOCET/ROXICET) 5-325 MG tablet Take 1 tablet by mouth every 4 (four) hours as needed for severe pain. 30 tablet 0  . traMADol (ULTRAM) 50 MG tablet Take 1 tablet (50 mg total) by mouth every 8 (eight) hours as needed. 60 tablet 0  . traZODone (DESYREL) 50 MG tablet Take by mouth.    . valsartan-hydrochlorothiazide (DIOVAN-HCT) 320-25 MG per tablet TAKE ONE TABLET BY MOUTH ONCE DAILY    . labetalol (NORMODYNE) 100 MG tablet Take 100 mg by mouth 2 (two) times daily. Reported on 02/04/2016     No current facility-administered medications on file prior to visit.     No Known Allergies  Social History   Social History  . Marital status: Married    Spouse name: N/A  . Number of children: 1  . Years of education: N/A   Occupational History  . [employer]  Social History Main Topics  . Smoking status: Former Smoker    Quit date: 09/17/2011  . Smokeless tobacco: Never Used  . Alcohol use No  . Drug use: No  . Sexual activity: Yes    Birth control/ protection: Condom   Other Topics Concern  . Not on file   Social History Narrative  . No narrative on file    Family History  Problem Relation Age of Onset  . Hypertension Mother   . Osteoarthritis Mother   . Diabetes Mother     The following portions of the patient's history were reviewed and updated as appropriate: allergies, current medications, past family history, past medical history, past social history, past surgical history and problem list.  Review of Systems  Review of Systems - Negative except as noted above History obtained from the patient   Objective:   BP 124/85   Pulse (!) 108   Ht 5\' 3"  (1.6 m)    Wt 235 lb 7 oz (106.8 kg)   LMP 07/13/2016 (Exact Date)   BMI 41.71 kg/m   CONSTITUTIONAL: Well-developed, well-nourished female in no acute distress.   HENT:  Normocephalic, atraumatic.   NECK: Normal range of motion, supple, no masses.  Normal thyroid.   SKIN: Skin is warm and dry. No rash noted. Not diaphoretic. No erythema. No pallor.  NEUROLGIC: Alert and oriented to person, place, and time.   PSYCHIATRIC: Normal mood and affect. Normal behavior. Normal judgment and thought content.  CARDIOVASCULAR: regular rate and rhythm.  RESPIRATORY: Clear breath sound bilaterally.  BREASTS: Not Examined  ABDOMEN: Soft, non distended; Non tender.  No Organomegaly.  PELVIC:  External Genitalia: Normal  Vagina: Normal  Cervix: Normal  Uterus: Normal size, shape,consistency, mobile  Adnexa: Normal  UPT negative   Assessment:   1. Amenorrhea - POCT urine pregnancy   2. Irregular menses   Plan:   PAP  RTC x 1 week for lab work and ultrasound, see orders RTC x 2 weeks to review results   Gunnar Bulla, CNM

## 2016-11-13 NOTE — Addendum Note (Signed)
Addended by: Brooke DareSICK, Tannisha Kennington L on: 11/13/2016 01:01 PM   Modules accepted: Orders

## 2016-11-15 LAB — PAP IG AND HPV HIGH-RISK
HPV, HIGH-RISK: NEGATIVE
PAP SMEAR COMMENT: 0

## 2016-11-17 ENCOUNTER — Other Ambulatory Visit: Payer: Self-pay | Admitting: Certified Nurse Midwife

## 2016-11-17 ENCOUNTER — Other Ambulatory Visit: Payer: BLUE CROSS/BLUE SHIELD

## 2016-11-17 ENCOUNTER — Ambulatory Visit (INDEPENDENT_AMBULATORY_CARE_PROVIDER_SITE_OTHER): Payer: BLUE CROSS/BLUE SHIELD

## 2016-11-17 DIAGNOSIS — N926 Irregular menstruation, unspecified: Secondary | ICD-10-CM

## 2016-11-17 DIAGNOSIS — N912 Amenorrhea, unspecified: Secondary | ICD-10-CM

## 2016-11-17 NOTE — Progress Notes (Signed)
Subjective: Patient presents today status post reconstructive surgery to the left foot. DOS 09/07/2016. Status post reconstructive surgery to the right foot, DOS 07/13/2016. Patient states that she's doing well however she has noticed a lot of swelling in her bilateral feet. She also complains of stiffness and achiness.  Objective: Moderate edema noted to the bilateral feet. There is no evidence of infection. Skin incisions are all healed. Minimal scarring noted. Range of motion within normal limits to all pedal structures. Radiographic exam demonstrates intact orthopedic hardware with stable osteotomy sites.  Assessment: #1 status post reconstructive foot surgery left, DOS 09/07/16 #2 status post reconstructive foot surgery right, DOS 07/13/2016.  #3 bilateral lower extremity edema  Plan of care: Today the patient was evaluated. Prescription for compression stockings were provided for the patient. Recommend going to [facility]. The patient can continue minimal to moderate weightbearing to the bilateral lower extremities. Recommend good supportive shoe gear. Return to clinic in 3 months.  Felecia ShellingBrent M. Nassim Cosma, DPM [facility]  Dr. Felecia ShellingBrent M. Savayah Waltrip, DPM    [address]                                        [place], [place] [address]                Office [phone]  Fax [phone]

## 2016-11-19 LAB — COMPREHENSIVE METABOLIC PANEL
A/G RATIO: 1.4 (ref 1.2–2.2)
ALBUMIN: 4.2 g/dL (ref 3.5–5.5)
ALK PHOS: 128 IU/L — AB (ref 39–117)
ALT: 68 IU/L — ABNORMAL HIGH (ref 0–32)
AST: 47 IU/L — ABNORMAL HIGH (ref 0–40)
BILIRUBIN TOTAL: 0.4 mg/dL (ref 0.0–1.2)
BUN / CREAT RATIO: 10 (ref 9–23)
BUN: 7 mg/dL (ref 6–20)
CO2: 25 mmol/L (ref 18–29)
CREATININE: 0.7 mg/dL (ref 0.57–1.00)
Calcium: 9.6 mg/dL (ref 8.7–10.2)
Chloride: 101 mmol/L (ref 96–106)
GFR calc Af Amer: 130 mL/min/{1.73_m2} (ref >59)
GFR calc non Af Amer: 113 mL/min/{1.73_m2} (ref >59)
Globulin, Total: 2.9 g/dL (ref 1.5–4.5)
Glucose: 122 mg/dL — ABNORMAL HIGH (ref 65–99)
Potassium: 3.9 mmol/L (ref 3.5–5.2)
Sodium: 144 mmol/L (ref 134–144)
Total Protein: 7.1 g/dL (ref 6.0–8.5)

## 2016-11-19 LAB — LIPID PANEL
CHOLESTEROL TOTAL: 236 mg/dL — AB (ref 100–199)
Chol/HDL Ratio: 6.2 ratio units — ABNORMAL HIGH (ref 0.0–4.4)
HDL: 38 mg/dL — AB (ref >39)
LDL Calculated: 129 mg/dL — ABNORMAL HIGH (ref 0–99)
TRIGLYCERIDES: 343 mg/dL — AB (ref 0–149)
VLDL CHOLESTEROL CAL: 69 mg/dL — AB (ref 5–40)

## 2016-11-19 LAB — FSH/LH
FSH: 6.2 m[IU]/mL
LH: 6.9 m[IU]/mL

## 2016-11-19 LAB — CBC
HEMOGLOBIN: 14.7 g/dL (ref 11.1–15.9)
Hematocrit: 43.6 % (ref 34.0–46.6)
MCH: 32 pg (ref 26.6–33.0)
MCHC: 33.7 g/dL (ref 31.5–35.7)
MCV: 95 fL (ref 79–97)
PLATELETS: 342 10*3/uL (ref 150–379)
RBC: 4.6 x10E6/uL (ref 3.77–5.28)
RDW: 13.4 % (ref 12.3–15.4)
WBC: 6.4 10*3/uL (ref 3.4–10.8)

## 2016-11-19 LAB — PROGESTERONE: Progesterone: 0.2 ng/mL

## 2016-11-19 LAB — TESTOSTERONE, FREE, TOTAL, SHBG
SEX HORMONE BINDING: 30.2 nmol/L (ref 24.6–122.0)
Testosterone, Free: 5.1 pg/mL — ABNORMAL HIGH (ref 0.0–4.2)
Testosterone: 49 ng/dL — ABNORMAL HIGH (ref 8–48)

## 2016-11-19 LAB — PROLACTIN: Prolactin: 16 ng/mL (ref 4.8–23.3)

## 2016-11-19 LAB — TSH: TSH: 3.25 u[IU]/mL (ref 0.450–4.500)

## 2016-11-19 LAB — INSULIN, RANDOM: INSULIN: 27.8 u[IU]/mL — ABNORMAL HIGH (ref 2.6–24.9)

## 2016-11-19 LAB — DHEA-SULFATE: DHEA-SO4: 267.8 ug/dL (ref 57.3–279.2)

## 2016-11-21 ENCOUNTER — Ambulatory Visit: Payer: BLUE CROSS/BLUE SHIELD | Admitting: Dietician

## 2016-11-21 LAB — BETA HCG QUANT (REF LAB): hCG Quant: <1 m[IU]/mL

## 2016-11-21 LAB — PLEASE NOTE

## 2016-11-23 ENCOUNTER — Ambulatory Visit: Payer: BLUE CROSS/BLUE SHIELD | Admitting: Dietician

## 2016-11-24 ENCOUNTER — Encounter: Payer: Self-pay | Admitting: Certified Nurse Midwife

## 2016-11-24 ENCOUNTER — Ambulatory Visit (INDEPENDENT_AMBULATORY_CARE_PROVIDER_SITE_OTHER): Payer: BLUE CROSS/BLUE SHIELD | Admitting: Certified Nurse Midwife

## 2016-11-24 VITALS — BP 115/85 | HR 101 | Ht 63.0 in | Wt 237.7 lb

## 2016-11-24 DIAGNOSIS — Z7189 Other specified counseling: Secondary | ICD-10-CM | POA: Diagnosis not present

## 2016-11-24 DIAGNOSIS — E282 Polycystic ovarian syndrome: Secondary | ICD-10-CM

## 2016-11-24 DIAGNOSIS — B372 Candidiasis of skin and nail: Secondary | ICD-10-CM | POA: Diagnosis not present

## 2016-11-24 MED ORDER — NYSTATIN 100000 UNIT/GM EX POWD: Freq: Four times a day (QID) | CUTANEOUS | 0 refills | Status: AC

## 2016-11-24 MED ORDER — METFORMIN HCL 500 MG PO TABS: ORAL_TABLET | ORAL | 5 refills | Status: AC

## 2016-11-24 MED ORDER — MEDROXYPROGESTERONE ACETATE 10 MG PO TABS: 10.0000 mg | ORAL_TABLET | Freq: Every day | ORAL | 3 refills | Status: AC

## 2016-11-24 NOTE — Patient Instructions (Signed)
Diet for Polycystic Ovarian Syndrome Polycystic ovary syndrome (PCOS) is a disorder of the chemical messengers (hormones) that regulate menstruation. The condition causes important hormones to be out of balance. PCOS can:  Make your periods irregular or stop.  Cause cysts to develop on the ovaries.  Make it difficult to get pregnant.  Stop your body from responding to the effects of insulin (insulin resistance), which can lead to obesity and diabetes. Changing what you eat can help manage PCOS and improve your health. It can help you lose weight and improve the way your body uses insulin. What is my plan?  Eat breakfast, lunch, and dinner plus two snacks every day.  Include protein in each meal and snack.  Choose whole grains instead of products made with refined flour.  Eat a variety of foods.  Exercise regularly as told by your health care provider. What do I need to know about this eating plan? If you are overweight or obese, pay attention to how many calories you eat. Cutting down on calories can help you lose weight. Work with your health care provider or dietitian to figure out how many calories you need each day. What foods can I eat? Grains  Whole grains, such as whole wheat. Whole-grain breads, crackers, cereals, and pasta. Unsweetened oatmeal, bulgur, barley, quinoa, or brown rice. Corn or whole-wheat flour tortillas. Vegetables   Lettuce. Spinach. Peas. Beets. Cauliflower. Cabbage. Broccoli. Carrots. Tomatoes. Squash. Eggplant. Herbs. Peppers. Onions. Cucumbers. Brussels sprouts. Fruits  Berries. Bananas. Apples. Oranges. Grapes. Papaya. Mango. Pomegranate. Kiwi. Grapefruit. Cherries. Meats and Other Protein Sources  Lean proteins, such as fish, chicken, beans, eggs, and tofu. Dairy  Low-fat dairy products, such as skim milk, cheese sticks, and yogurt. Beverages  Low-fat or fat-free drinks, such as water, low-fat milk, sugar-free drinks, and 100% fruit  juice. Condiments  Ketchup. Mustard. Barbecue sauce. Relish. Low-fat or fat-free mayonnaise. Fats and Oils  Olive oil or canola oil. Walnuts and almonds. The items listed above may not be a complete list of recommended foods or beverages. Contact your dietitian for more options.  What foods are not recommended? Foods high in calories or fat. Fried foods. Sweets. Products made from refined white flour, including white bread, pastries, white rice, and pasta. The items listed above may not be a complete list of foods and beverages to avoid. Contact your dietitian for more information.  This information is not intended to replace advice given to you by your health care provider. Make sure you discuss any questions you have with your health care provider. Document Released: 12/20/2015 Document Revised: 02/03/2016 Document Reviewed: 09/09/2014 Elsevier Interactive Patient Education  2017 Elsevier Inc.  

## 2016-11-24 NOTE — Progress Notes (Signed)
GYN ENCOUNTER NOTE  Subjective:       Cassandra Carter is a 36 y.o. G65P1010 female is here for review of lab work and ultrasound. She also reports and red itchy rash under her breast for the last few days.   Previously seen 11/10/2016 on for irregular menses and amenorrhea.   Denies difficulty breathing or respiratory distress, chest pain, abdominal pain, vaginal bleeding, and leg pain and swelling.    Gynecologic History No LMP recorded. Patient is not currently having periods (Reason: Irregular Periods).   Contraception: condoms   Last Pap: 2016. Results were: normal  Obstetric History OB History  Gravida Para Term Preterm AB Living  3 1 1   1     SAB TAB Ectopic Multiple Live Births  1            # Outcome Date GA Lbr Len/2nd Weight Sex Delivery Anes PTL Lv  3 Gravida           2 SAB           1 Term               Past Medical History:  Diagnosis Date  . Anxiety   . Bipolar disorder (HCC)   . Depression   . Headache   . History of miscarriage June 2011   1st trimester  . Hypertension   . Shortness of breath dyspnea   . Vesicoureteral reflux with resulting kidney disease, unilateral     Past Surgical History:  Procedure Laterality Date  . ESOPHAGOGASTRODUODENOSCOPY (EGD) WITH PROPOFOL N/A 06/28/2015   Procedure: ESOPHAGOGASTRODUODENOSCOPY (EGD) WITH PROPOFOL;  Surgeon: Elnita Maxwell, MD;  Location: [facility] ENDOSCOPY;  Service: Endoscopy;  Laterality: N/A;    Current Outpatient Prescriptions on File Prior to Visit  Medication Sig Dispense Refill  . Cyanocobalamin (RA VITAMIN B-12 TR) 1000 MCG TBCR Take by mouth.    . DULoxetine (CYMBALTA) 60 MG capsule     . labetalol (NORMODYNE) 100 MG tablet Take 100 mg by mouth 2 (two) times daily. Reported on 02/04/2016    . LamoTRIgine 50 MG TB24 Take by mouth.    . nabumetone (RELAFEN) 750 MG tablet Take 750 mg by mouth 2 (two) times daily.    Marland Kitchen oxyCODONE-acetaminophen (PERCOCET/ROXICET) 5-325 MG tablet Take 1  tablet by mouth every 4 (four) hours as needed for severe pain. 30 tablet 0  . Potassium 95 MG TABS Take by mouth.    . traMADol (ULTRAM) 50 MG tablet Take 1 tablet (50 mg total) by mouth every 8 (eight) hours as needed. 60 tablet 0  . traZODone (DESYREL) 50 MG tablet Take by mouth.    . valsartan-hydrochlorothiazide (DIOVAN-HCT) 320-25 MG per tablet TAKE ONE TABLET BY MOUTH ONCE DAILY     No current facility-administered medications on file prior to visit.     No Known Allergies  Social History   Social History  . Marital status: Married    Spouse name: N/A  . Number of children: 1  . Years of education: N/A   Occupational History  . [employer]   Social History Main Topics  . Smoking status: Former Smoker    Quit date: 09/17/2011  . Smokeless tobacco: Never Used  . Alcohol use No  . Drug use: No  . Sexual activity: Yes    Birth control/ protection: Condom   Other Topics Concern  . Not on file   Social History Narrative  . No narrative on file  Family History  Problem Relation Age of Onset  . Hypertension Mother   . Osteoarthritis Mother   . Diabetes Mother     The following portions of the patient's history were reviewed and updated as appropriate: allergies, current medications, past family history, past medical history, past social history, past surgical history and problem list.  Review of Systems  Review of Systems - Negative except as noted above History obtained from the patient  Objective:   BP 115/85 (BP Location: Left Arm, Patient Position: Sitting, Cuff Size: Large)   Pulse (!) 101   Ht 5\' 3"  (1.6 m)   Wt 237 lb 11.2 oz (107.8 kg)   BMI 42.11 kg/m   Alert and oriented x 4  SKIN: Flat, red, pin sized dots under bilateral breasts  CBC    Component Value Date/Time   WBC 6.4 11/17/2016 0849   WBC 7.4 12/02/2011 1551   WBC 9.6 12/19/2010 1118   RBC 4.60 11/17/2016 0849   RBC 4.28 12/02/2011 1551   RBC 4.43 12/19/2010 1118    HGB 14.4 12/02/2011 1551   HCT 43.6 11/17/2016 0849   PLT 342 11/17/2016 0849   MCV 95 11/17/2016 0849   MCV 95 12/02/2011 1551   MCH 32.0 11/17/2016 0849   MCH 33.7 12/02/2011 1551   MCHC 33.7 11/17/2016 0849   MCHC 35.6 12/02/2011 1551   MCHC 35.3 12/19/2010 1118   RDW 13.4 11/17/2016 0849   RDW 12.9 12/02/2011 1551   LYMPHSABS 2.0 12/19/2010 1118   MONOABS 0.3 12/19/2010 1118   EOSABS 0.1 12/19/2010 1118   BASOSABS 0.0 12/19/2010 1118   CMP     Component Value Date/Time   NA 144 11/17/2016 0849   NA 143 12/02/2011 1551   K 3.9 11/17/2016 0849   K 3.2 (L) 12/02/2011 1551   CL 101 11/17/2016 0849   CL 102 12/02/2011 1551   CO2 25 11/17/2016 0849   CO2 31 12/02/2011 1551   GLUCOSE 122 (H) 11/17/2016 0849   GLUCOSE 99 12/02/2011 1551   BUN 7 11/17/2016 0849   BUN 4 (L) 12/02/2011 1551   CREATININE 0.70 11/17/2016 0849   CREATININE 0.77 01/10/2012 0726   CALCIUM 9.6 11/17/2016 0849   CALCIUM 8.7 12/02/2011 1551   PROT 7.1 11/17/2016 0849   PROT 7.9 12/02/2011 1551   ALBUMIN 4.2 11/17/2016 0849   ALBUMIN 4.0 12/02/2011 1551   AST 47 (H) 11/17/2016 0849   AST 28 12/02/2011 1551   ALT 68 (H) 11/17/2016 0849   ALT 31 12/02/2011 1551   ALKPHOS 128 (H) 11/17/2016 0849   ALKPHOS 51 12/02/2011 1551   BILITOT 0.4 11/17/2016 0849   BILITOT 0.4 12/02/2011 1551   GFRNONAA 113 11/17/2016 0849   GFRNONAA >60 01/10/2012 0726   GFRAA 130 11/17/2016 0849   GFRAA >60 01/10/2012 0726   TSH     Ref Range & Units 7d ago   TSH 0.450 - 4.500 uIU/mL 3.250        FSH/LH    Ref Range & Units 7d ago  LH mIU/mL 6.9   Comments:          Adult Female:             Follicular phase   2.4 - 12.6             Ovulation phase   14.0 - 95.6             Luteal phase     1.0 - 11.4  Postmenopausal    7.7 - 58.5   FSH mIU/mL 6.2   Comments:          Adult Female:              Follicular phase   3.5 - 12.5             Ovulation phase    4.7 - 21.5             Luteal phase     1.7 -  7.7             Postmenopausal    25.8 - 134.8        DHEA-sulfate    Ref Range & Units 7d ago  DHEA-SO4 57.3 - 279.2 ug/dL 409.8        Insulin, random    Ref Range & Units 7d ago  INSULIN 2.6 - 24.9 uIU/mL 27.8          Progesterone    Ref Range & Units 7d ago  Progesterone ng/mL 0.2   Comments:           Follicular phase    0.1 -  0.9            Luteal phase      1.8 - 23.9            Ovulation phase    0.1 - 12.0            Pregnant              First trimester  11.0 - 44.3              Second trimester  25.4 - 83.3              Third trimester  58.7 - 214.0            Postmenopausal     0.0 -  0.1          Prolactin    Ref Range & Units 7d ago  Prolactin 4.8 - 23.3 ng/mL 16.0         Testosterone, Free, Total, SHBG    Ref Range & Units 7d ago  Testosterone 8 - 48 ng/dL 49    Testosterone, Free 0.0 - 4.2 pg/mL 5.1    Sex Hormone Binding 24.6 - 122.0 nmol/L 30.2         Lipid panel     Ref Range & Units 7d ago   Cholesterol, Total 100 - 199 mg/dL 119     Triglycerides 0 - 149 mg/dL 147     HDL >82 mg/dL 38     VLDL Cholesterol Cal 5 - 40 mg/dL 69     LDL Calculated 0 - 99 mg/dL 956     Chol/HDL Ratio 0.0 - 4.4 ratio units 6.2           ULTRASOUND REPORT  Location: [facility] Date of Service: 11/17/16   Indications: Amenorrhea and Irregular Menses Findings:  The uterus is anteverted and measures 8.6 x 3.6 x 4.2 cm. Echo texture is homogenous without evidence of focal masses.  The Endometrium is at the upper limits of normal at 14.1 mm.  Right Ovary measures 3.9 x 2.0 x 2.1 cm. There is a dominant follicle, however, no obvious evidence of PCOS. Left  Ovary measures 3.0 x 1.5 x 2.0 cm. There are 2 dominant follicles seen, however, no obvious evidence of PCOS. Survey of the adnexa demonstrates no  adnexal masses. There is no free fluid in the cul de sac.  Impression: 1. Normal appearing uterus and ovaries. 2. Slightly thickened endometrium at 14.1 mm.  Assessment:   PCOS  Counseling and coordination of care  Yeast infection of the skin  Plan:   1. Lab results and ultrasound reviewed. ACOG PCOS handout given. Discussed treatment options including Mirena IUD, Aldactone, Metformin, Provera challenge four (4) times a year, and OCPs.   2. Decision for Metformin and Provera challenge four (4) times a year. Will hold off on Aldactone due to patient history of kidney disease. See orders  3. Advised pt to follow up with PCP for management of other abnormal labs.   4. Rx Nystatin powder and home care measures reviewed for treatment of skin yeast.   RTC if symptoms worsen or fail to improve  RTC x one (1) year for annual exam or sooner if needed   Gunnar Bulla, CNM  A total of 15 minutes were spent face-to-face with the patient during this encounter and over half of that time dealt with counseling and coordination of care.

## 2016-11-30 ENCOUNTER — Telehealth: Payer: Self-pay | Admitting: Podiatry

## 2016-11-30 ENCOUNTER — Encounter: Payer: Self-pay | Admitting: Dietician

## 2016-11-30 ENCOUNTER — Encounter: Payer: BLUE CROSS/BLUE SHIELD | Attending: Psychiatry | Admitting: Dietician

## 2016-11-30 VITALS — Ht 63.0 in | Wt 236.2 lb

## 2016-11-30 DIAGNOSIS — E6609 Other obesity due to excess calories: Secondary | ICD-10-CM

## 2016-11-30 DIAGNOSIS — F509 Eating disorder, unspecified: Secondary | ICD-10-CM

## 2016-11-30 DIAGNOSIS — Z6839 Body mass index (BMI) 39.0-39.9, adult: Secondary | ICD-10-CM

## 2016-11-30 NOTE — Telephone Encounter (Signed)
Patient called stating that she is having PT since end of Feb. Today she had a very heavy therapy session and patient is experiencing numbness and pain in the surgical foot when weight bearing. Patient says she feels like her ankle is going to shatter. Would like a nurse to call her back to discuss is this is normal after PT.

## 2016-11-30 NOTE — Progress Notes (Signed)
Medical Nutrition Therapy Follow-up visit:  Time with patient: 8:30-9:25am Visit #:1st visit this year, 11th visit overall ASSESSMENT:  Diagnosis:obesity; eating disorder  Current weight:236.2 lbs    Height: 63 in Medications: See list Medical History: hypertension, kidney disease, bipolar Progress and evaluation: Patient in for medical nutrition therapy follow-up appointment.  Foot surgeries 07/2016 and 08/2016 have limited movement and physical activity. She reports that since surgeries, there have been more "take out" and dining "out" meals including frequent high fat choices and increased intake of sweetened beverages. She states she has been recently diagnosed with PCOS and her 11/2016 labwork indicated a 122 FBG as well as an elevated insulin level. She therefore, more recently, has been decreasing her soda intake and increased water intake and is being more mindful of food choices. Most of her lunch meals continue to be "take out" meals and several of her evening meals are as well. Since her 05/26/16 visit, she has gained approximately 10 lbs. She denies any purging during this time.  Physical activity:physical therapy only   NUTRITION CARE EDUCATION:    Weight control/PCOS: Discussed how meal plan principles instructed on in past visits are applicable for PCOS diagnosis. Re-instructed on balance of protein, carbohydrate and fat using food models, food guide plate and "Planning a Balanced Meal" hand-out. Reviewed identifying carbohydrate foods as well as portions but again cautioned about being overly restrictive which will only increase risk of purging. Encouraged to use meal plan instructed on as a guide in being more mindful regarding her food choices. Gave and reviewed sample menus.  NUTRITIONAL DIAGNOSIS:  NI-1.5 Excessive energy intake As related to frequent dining out and high fat food choices as well as sweetened beverages.  As evidenced by diet history..  Surgery and  subsequent limited movement/activity also a factor in weight gain. INTERVENTION:  Balance meals with protein, 2-3 servings of carbohydrate and non-starchy vegetables. Refer to handout for examples. Fill 1/3-1/2 plate with the raw vegetables and cooked non-starchy vegetables that you like. Continue to limit sweetened beverages with goal of eliminating. Do arm chair exercises at least 3 days per week.  EDUCATION MATERIALS GIVEN:  . Plate Planner . Food lists/ Planning A Balanced Meal . Sample meal pattern/ menus . Goals/ instructions  LEARNER/ who was taught:  . Patient  LEVEL OF UNDERSTANDING: . Partial understanding; needs review/ practice LEARNING BARRIERS: . None WILLINGNESS TO LEARN/READINESS FOR CHANGE: . Acceptance, ready for change  MONITORING AND EVALUATION:  Weight, diet, exercise              Follow-up: 01/18/2017

## 2016-11-30 NOTE — Patient Instructions (Addendum)
Balance meals with protein, 2-3 servings of carbohydrate and non-starchy vegetables. Refer to handout for examples. Fill 1/3-1/2 plate with the raw vegetables and cooked non-starchy vegetables that you like. Continue to limit sweetened beverages with goal of eliminating. Do arm chair exercises at least 3 days per week.

## 2016-12-01 NOTE — Telephone Encounter (Signed)
I left patient a message to give us a call to schedule an appointment with Dr. Stacie AcresMayer in our [place] location.  Dr. Logan BoresEvans is not in the office today.

## 2016-12-04 ENCOUNTER — Other Ambulatory Visit: Payer: Self-pay

## 2016-12-04 MED ORDER — OXYCODONE-ACETAMINOPHEN 5-325 MG PO TABS: 1.0000 | ORAL_TABLET | ORAL | 0 refills | Status: AC | PRN

## 2016-12-04 NOTE — Telephone Encounter (Signed)
Rx for Percocet has been printed and given to patient at front desk per Dr. Logan BoresEvans

## 2016-12-06 ENCOUNTER — Telehealth: Payer: Self-pay | Admitting: Certified Nurse Midwife

## 2016-12-06 NOTE — Telephone Encounter (Signed)
Patient was given a pills to take to start her period and she had not started. She also states she is not feeling well, she feels hot but no fever. Please Advise.

## 2016-12-07 NOTE — Telephone Encounter (Signed)
Pt advised.

## 2016-12-07 NOTE — Telephone Encounter (Signed)
Withdrawal bleeding should have occurred two (2) to seven (7) days after stopping the progesterone. If no withdrawal bleeding occurs, diagnostic reevaluation is need with a MD. If she has waited this time frame and no bleeding has occurred, then she needs to follow up with one of the MDs in office in the next two (2) weeks. JML

## 2016-12-12 ENCOUNTER — Encounter: Payer: Self-pay | Admitting: Podiatry

## 2016-12-12 ENCOUNTER — Ambulatory Visit (INDEPENDENT_AMBULATORY_CARE_PROVIDER_SITE_OTHER): Payer: BLUE CROSS/BLUE SHIELD | Admitting: Podiatry

## 2016-12-12 ENCOUNTER — Ambulatory Visit (INDEPENDENT_AMBULATORY_CARE_PROVIDER_SITE_OTHER): Payer: BLUE CROSS/BLUE SHIELD

## 2016-12-12 DIAGNOSIS — M25572 Pain in left ankle and joints of left foot: Secondary | ICD-10-CM

## 2016-12-12 DIAGNOSIS — R600 Localized edema: Secondary | ICD-10-CM | POA: Diagnosis not present

## 2016-12-12 DIAGNOSIS — M79672 Pain in left foot: Secondary | ICD-10-CM | POA: Diagnosis not present

## 2016-12-12 DIAGNOSIS — M722 Plantar fascial fibromatosis: Secondary | ICD-10-CM

## 2016-12-12 DIAGNOSIS — M7752 Other enthesopathy of left foot: Secondary | ICD-10-CM

## 2016-12-12 DIAGNOSIS — M7672 Peroneal tendinitis, left leg: Secondary | ICD-10-CM

## 2016-12-12 DIAGNOSIS — Z9889 Other specified postprocedural states: Secondary | ICD-10-CM | POA: Diagnosis not present

## 2016-12-12 DIAGNOSIS — M65972 Unspecified synovitis and tenosynovitis, left ankle and foot: Secondary | ICD-10-CM

## 2016-12-12 DIAGNOSIS — M659 Synovitis and tenosynovitis, unspecified: Secondary | ICD-10-CM

## 2016-12-13 NOTE — Progress Notes (Signed)
Subjective: Patient presents today status post reconstructive surgery to the left foot. DOS 09/07/2016. Status post reconstructive surgery to the right foot, DOS 07/13/2016. Patient states that she's doing well however she reports her right foot is doing better but reports her left foot has worsened. She reports a sharp, stabbing pain in the left foot. Pt is still in physical therapy. Objective: Moderate edema noted to left ankle. There is no evidence of infection. Skin incisions are all healed. Minimal scarring noted. Range of motion within normal limits to all pedal structures. Radiographic exam demonstrates intact orthopedic hardware with stable osteotomy sites. Pain on palpation to medial heel of left foot. Pain on palpation to peroneal tendons of left foot.  Assessment: #1 status post reconstructive foot surgery left, DOS 09/07/16 #2 status post reconstructive foot surgery right, DOS 07/13/2016.  #3 left ankle edema #4 plantar fasciitis left #5 Peroneal tendinitis left  Plan of care: Today the patient was evaluated. The patient can continue minimal to moderate weightbearing to the bilateral lower extremities. 0.5 mls of Celestone Soluspan injected into plantar fascia of left foot. 0.5 mls of Celestone Soluspan injected into peroneal tendons of left foot. Recommend good supportive shoe gear. Return to clinic in 6 weeks.  Felecia Shelling, DPM [facility]  Dr. Felecia Shelling, DPM    [address]                                        [place], [place] [address]                Office [phone]  Fax [phone]

## 2016-12-14 MED ORDER — BETAMETHASONE SOD PHOS & ACET 6 (3-3) MG/ML IJ SUSP: 3.0000 mg | Freq: Once | INTRAMUSCULAR | Status: AC

## 2016-12-15 MED ORDER — METHYLPREDNISOLONE 4 MG PO TABS: ORAL_TABLET | ORAL | 0 refills | Status: AC

## 2016-12-15 NOTE — Addendum Note (Signed)
Addended by: Geraldine Contras D on: 12/15/2016 11:05 AM   Modules accepted: Orders

## 2017-01-18 ENCOUNTER — Encounter: Payer: BLUE CROSS/BLUE SHIELD | Attending: Psychiatry | Admitting: Dietician

## 2017-01-18 ENCOUNTER — Encounter: Payer: Self-pay | Admitting: Dietician

## 2017-01-18 VITALS — Ht 63.0 in | Wt 238.1 lb

## 2017-01-18 DIAGNOSIS — Z6839 Body mass index (BMI) 39.0-39.9, adult: Secondary | ICD-10-CM | POA: Diagnosis not present

## 2017-01-18 DIAGNOSIS — E6609 Other obesity due to excess calories: Secondary | ICD-10-CM

## 2017-01-18 DIAGNOSIS — F509 Eating disorder, unspecified: Secondary | ICD-10-CM

## 2017-01-18 NOTE — Patient Instructions (Signed)
Resume gym exercise with goal of 2 days per week. Continue with yoga routine 3 days per week. Continue to increase fruit and vegetable intake. Include a fruit or vegetable or both at meals. Allow food guide plate for diabetes to be your guide including protein, 2-3 servings of carbohydrate (starch, fruit, milk or yogurt) and non-starchy vegetables.

## 2017-01-18 NOTE — Progress Notes (Signed)
Medical Nutrition Therapy Follow-up visit:  Time with patient: 8:30-9:00am Visit #:2nd visit this year ASSESSMENT:  Diagnosis:obesity, eating disorder  Current weight:238.1 lbs    Height: 63 in Medications: See list Medical History: hypertension, kidney disease, bipolar Progress and evaluation: Patient in for medical nutrition therapy follow-up appointment. She reports that now that her feet have healed, she is able to do be more active. She joined a gym approximately 1 month ago and has been at least 4 times and does cardio for 15 minutes and some weight bearing exercises. This week she has also started a yoga program. Regarding meals, she states, "We are getting back on track". She is eating 3 meals and states she has a decreased appetite possible related to taking metformin. She is including more vegetables and fruits as snacks. She and family are eating "out" much less frequently than at previous visit. She reports she had gained up to 248 lbs. Her present weight is 2 lbs more than weight at previous visit but is 10 lbs less than the 248 lbs she reached. Patient denies any purging during this time.  NUTRITION CARE EDUCATION:  Weight control:  Commended on resuming healthier eating and exercise habits after difficult time after foot surgery. Discussed how the exercise decreases the insulin resistance to allow her body to utilize glucose and lower blood sugar. Reviewed the impact of diet on insulin resistance, blood pressure and blood lipids and how her efforts to improve diet will help in all of these areas. Obtained 24 hour food/beverage recall and  reviewed food guide plate for diabetes.    INTERVENTION:  Resume gym exercise with goal of 2 days per week. Continue with yoga routine 3 days per week. Continue to increase fruit and vegetable intake. Include a fruit or vegetable or both at meals. Allow food guide plate for diabetes to be your guide including protein, 2-3 servings of  carbohydrate (starch, fruit, milk or yogurt) and non-starchy vegetables  EDUCATION MATERIALS GIVEN:  . Goals/ instructions  LEARNER/ who was taught:  . Patient   LEVEL OF UNDERSTANDING: . Verbalizes/ demonstrates competency Demonstrated degree of understanding via teach back.  LEARNING BARRIERS: . None WILLINGNESS TO LEARN/READINESS FOR CHANGE: . Change in Progress  MONITORING AND EVALUATION:  Weight, diet, exercise              Follow-up: 03/15/17 at 8:30am

## 2017-01-19 ENCOUNTER — Ambulatory Visit (INDEPENDENT_AMBULATORY_CARE_PROVIDER_SITE_OTHER): Payer: BLUE CROSS/BLUE SHIELD | Admitting: Podiatry

## 2017-01-19 ENCOUNTER — Encounter: Payer: Self-pay | Admitting: Podiatry

## 2017-01-19 DIAGNOSIS — G8929 Other chronic pain: Secondary | ICD-10-CM

## 2017-01-19 DIAGNOSIS — R600 Localized edema: Secondary | ICD-10-CM | POA: Diagnosis not present

## 2017-01-19 DIAGNOSIS — M25572 Pain in left ankle and joints of left foot: Secondary | ICD-10-CM

## 2017-01-21 NOTE — Progress Notes (Signed)
Subjective: Patient presents today status post reconstructive surgery to the left foot. DOS 09/07/2016. Status post reconstructive surgery to the right foot, DOS 07/13/2016. Patient reports her right foot is doing well but she is still experiencing pain in the left foot and ankle.   Objective: Moderate edema noted to left ankle. There is no evidence of infection. Skin incisions are all healed. Minimal scarring noted. Range of motion within normal limits to all pedal structures. Pain on palpation to medial heel of left foot. Pain on palpation to peroneal tendons of left foot.  Assessment: #1 status post reconstructive foot surgery left, DOS 09/07/16- doing well #2 status post reconstructive foot surgery right, DOS 07/13/2016- doing well #3 left ankle pain  Plan of care: Today the patient was evaluated. Left ankle brace dispensed. Note for work provided to be out through June 30. Return to clinic in 2 months.  Felecia ShellingBrent M. Jyl Chico, DPM [facility]  Dr. Felecia ShellingBrent M. Hays Dunnigan, DPM    [address]                                        [place], [place] [address]                Office [phone]  Fax [phone]

## 2017-01-23 ENCOUNTER — Ambulatory Visit: Payer: BLUE CROSS/BLUE SHIELD | Admitting: Podiatry

## 2017-03-09 ENCOUNTER — Ambulatory Visit: Payer: BLUE CROSS/BLUE SHIELD | Admitting: Podiatry

## 2017-03-13 ENCOUNTER — Ambulatory Visit: Payer: BLUE CROSS/BLUE SHIELD | Admitting: Podiatry

## 2017-03-15 ENCOUNTER — Ambulatory Visit: Payer: BLUE CROSS/BLUE SHIELD | Admitting: Dietician

## 2017-03-20 ENCOUNTER — Ambulatory Visit (INDEPENDENT_AMBULATORY_CARE_PROVIDER_SITE_OTHER): Payer: BLUE CROSS/BLUE SHIELD | Admitting: Podiatry

## 2017-03-20 ENCOUNTER — Encounter: Payer: Self-pay | Admitting: Podiatry

## 2017-03-20 ENCOUNTER — Ambulatory Visit (INDEPENDENT_AMBULATORY_CARE_PROVIDER_SITE_OTHER): Payer: BLUE CROSS/BLUE SHIELD

## 2017-03-20 DIAGNOSIS — Z9889 Other specified postprocedural states: Secondary | ICD-10-CM | POA: Diagnosis not present

## 2017-03-20 DIAGNOSIS — M216X9 Other acquired deformities of unspecified foot: Secondary | ICD-10-CM | POA: Diagnosis not present

## 2017-03-20 MED ORDER — GABAPENTIN 100 MG PO CAPS: 100.0000 mg | ORAL_CAPSULE | Freq: Three times a day (TID) | ORAL | 3 refills | Status: DC

## 2017-03-26 ENCOUNTER — Telehealth: Payer: Self-pay | Admitting: Podiatry

## 2017-03-26 NOTE — Telephone Encounter (Signed)
Patient's disability carrier has requested office notes from her last visit on 03/20/17 and they have not been attached. This information has to be returned to them by 03/27/17- tomorrow. Can you take a look and dictate the notes when you have a minute?  Thank you!

## 2017-03-27 MED ORDER — GABAPENTIN 100 MG PO CAPS: 100.0000 mg | ORAL_CAPSULE | Freq: Three times a day (TID) | ORAL | 3 refills | Status: AC

## 2017-04-07 NOTE — Progress Notes (Signed)
   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 Patient states that she's doing much better however she still has occasional intermittent swelling in the left foot. She believes that the ankle brace that she is wearing significantly helps. Patient also experiences some tingling sensations to the right foot  has been going on for the past 2 months.   Physical Exam: General: The patient is alert and oriented x3 in no acute distress.  Dermatology: Skin is warm, dry and supple bilateral lower extremities. Negative for open lesions or macerations. incision sites noted to the bilateral lower extremities due to reconstructive foot surgery   Vascular: Palpable pedal pulses bilaterally. No edema or erythema noted. Capillary refill within normal limits.  Neurological: Epicritic and protective threshold grossly intact bilaterally.   Musculoskeletal Exam: Range of motion within normal limits to all pedal and ankle joints bilateral. Muscle strength 5/5 in all groups bilateral.   Radiographic Exam:  Orthopedic hardware appears to be intact with all osteotomy sites healed. Joint spaces appear to be preserved.   Assessment: 1.  status post reconstructive foot surgery left, date of surgery 08/30/2016   2. Status post reconstructive foot surgery right, date of surgery 07/13/2016  3. Tingling sensation right foot   Plan of Care:  1. Patient was evaluated.x-rays reviewed today  2.  prescription for gabapentin 100 mg to see if it alleviates the sensations   3. Recommend custom molded insoles in the future due to her specific foot type   4. Recommend good shoe gear 5. Return to clinic when necessary  Felecia ShellingBrent M. Laetitia Schnepf, DPM [facility]  Dr. Felecia ShellingBrent M. Michaeljames Milnes, DPM    [address]                                        [place], [place] [address]                Office [phone]    Fax [phone]

## 2017-04-10 ENCOUNTER — Encounter: Payer: Self-pay | Admitting: Dietician

## 2022-12-06 ENCOUNTER — Other Ambulatory Visit: Payer: Self-pay | Admitting: Family Medicine

## 2022-12-06 DIAGNOSIS — Z1231 Encounter for screening mammogram for malignant neoplasm of breast: Secondary | ICD-10-CM

## 2022-12-20 ENCOUNTER — Ambulatory Visit
Admission: RE | Admit: 2022-12-20 | Discharge: 2022-12-20 | Disposition: A | Discharge status: Home / Self Care | Payer: BC Managed Care – PPO | Source: Ambulatory Visit | Attending: Family Medicine | Admitting: Family Medicine

## 2022-12-20 DIAGNOSIS — Z1231 Encounter for screening mammogram for malignant neoplasm of breast: Secondary | ICD-10-CM
